# Patient Record
Sex: Male | Born: 1945 | Race: White | Hispanic: No | Marital: Married | Smoking: Current every day smoker
Health system: Southern US, Community
[De-identification: ages and names within clinical notes are randomized; demographics above are authoritative.]

## PROBLEM LIST (undated history)

## (undated) DIAGNOSIS — R0602 Shortness of breath: Secondary | ICD-10-CM

## (undated) DIAGNOSIS — K5730 Diverticulosis of large intestine without perforation or abscess without bleeding: Secondary | ICD-10-CM

## (undated) DIAGNOSIS — F17200 Nicotine dependence, unspecified, uncomplicated: Secondary | ICD-10-CM

## (undated) DIAGNOSIS — N200 Calculus of kidney: Secondary | ICD-10-CM

## (undated) DIAGNOSIS — F419 Anxiety disorder, unspecified: Secondary | ICD-10-CM

## (undated) DIAGNOSIS — R0789 Other chest pain: Secondary | ICD-10-CM

## (undated) DIAGNOSIS — I319 Disease of pericardium, unspecified: Secondary | ICD-10-CM

## (undated) DIAGNOSIS — M4306 Spondylolysis, lumbar region: Secondary | ICD-10-CM

## (undated) DIAGNOSIS — M1990 Unspecified osteoarthritis, unspecified site: Secondary | ICD-10-CM

## (undated) HISTORY — DX: Unspecified osteoarthritis, unspecified site: M19.90

## (undated) HISTORY — DX: Shortness of breath: R06.02

## (undated) HISTORY — PX: TONSILLECTOMY: SUR1361

## (undated) HISTORY — PX: COLONOSCOPY: SHX174

## (undated) HISTORY — DX: Nicotine dependence, unspecified, uncomplicated: F17.200

## (undated) HISTORY — DX: Anxiety disorder, unspecified: F41.9

## (undated) HISTORY — DX: Other chest pain: R07.89

---

## 1973-05-14 HISTORY — PX: BACK SURGERY: SHX140

## 1978-05-14 DIAGNOSIS — I319 Disease of pericardium, unspecified: Secondary | ICD-10-CM

## 1978-05-14 HISTORY — DX: Disease of pericardium, unspecified: I31.9

## 2001-11-05 ENCOUNTER — Emergency Department (HOSPITAL_COMMUNITY): Admission: EM | Admit: 2001-11-05 | Discharge: 2001-11-05 | Payer: Self-pay | Admitting: Emergency Medicine

## 2011-03-27 ENCOUNTER — Other Ambulatory Visit: Payer: Self-pay | Admitting: Family Medicine

## 2011-03-27 ENCOUNTER — Other Ambulatory Visit: Payer: Self-pay | Admitting: Anesthesiology

## 2011-03-27 DIAGNOSIS — M545 Low back pain: Secondary | ICD-10-CM

## 2011-03-30 ENCOUNTER — Ambulatory Visit
Admission: RE | Admit: 2011-03-30 | Discharge: 2011-03-30 | Disposition: A | Discharge status: Home / Self Care | Payer: BC Managed Care – PPO | Source: Ambulatory Visit | Attending: Family Medicine | Admitting: Family Medicine

## 2011-03-30 DIAGNOSIS — M545 Low back pain: Secondary | ICD-10-CM

## 2011-03-30 MED ORDER — GADOBENATE DIMEGLUMINE 529 MG/ML IV SOLN
16.0000 mL | Freq: Once | INTRAVENOUS | Status: AC | PRN
  Administered 2011-03-30: 16 mL via INTRAVENOUS

## 2011-10-07 ENCOUNTER — Encounter: Payer: Self-pay | Admitting: *Deleted

## 2012-01-28 ENCOUNTER — Encounter: Payer: Self-pay | Admitting: *Deleted

## 2013-08-31 ENCOUNTER — Other Ambulatory Visit (HOSPITAL_COMMUNITY): Payer: Self-pay | Admitting: Family Medicine

## 2013-08-31 ENCOUNTER — Other Ambulatory Visit: Payer: Self-pay

## 2013-08-31 ENCOUNTER — Inpatient Hospital Stay (HOSPITAL_COMMUNITY)
Admission: EM | Admit: 2013-08-31 | Discharge: 2013-09-05 | DRG: 419 | Disposition: A | Discharge status: Home / Self Care | Payer: Medicare HMO | Attending: General Surgery | Admitting: General Surgery

## 2013-08-31 ENCOUNTER — Ambulatory Visit (HOSPITAL_COMMUNITY)
Admission: RE | Admit: 2013-08-31 | Discharge: 2013-08-31 | Disposition: A | Discharge status: Home / Self Care | Payer: Medicare HMO | Source: Ambulatory Visit | Attending: Family Medicine | Admitting: Family Medicine

## 2013-08-31 ENCOUNTER — Encounter (HOSPITAL_COMMUNITY): Payer: Self-pay | Admitting: Surgery

## 2013-08-31 ENCOUNTER — Encounter (HOSPITAL_COMMUNITY): Payer: Self-pay

## 2013-08-31 DIAGNOSIS — M129 Arthropathy, unspecified: Secondary | ICD-10-CM | POA: Diagnosis present

## 2013-08-31 DIAGNOSIS — Z87442 Personal history of urinary calculi: Secondary | ICD-10-CM

## 2013-08-31 DIAGNOSIS — R1031 Right lower quadrant pain: Secondary | ICD-10-CM

## 2013-08-31 DIAGNOSIS — R197 Diarrhea, unspecified: Secondary | ICD-10-CM

## 2013-08-31 DIAGNOSIS — K449 Diaphragmatic hernia without obstruction or gangrene: Secondary | ICD-10-CM | POA: Insufficient documentation

## 2013-08-31 DIAGNOSIS — M47817 Spondylosis without myelopathy or radiculopathy, lumbosacral region: Secondary | ICD-10-CM | POA: Insufficient documentation

## 2013-08-31 DIAGNOSIS — K828 Other specified diseases of gallbladder: Secondary | ICD-10-CM | POA: Insufficient documentation

## 2013-08-31 DIAGNOSIS — N281 Cyst of kidney, acquired: Secondary | ICD-10-CM

## 2013-08-31 DIAGNOSIS — K8020 Calculus of gallbladder without cholecystitis without obstruction: Secondary | ICD-10-CM | POA: Insufficient documentation

## 2013-08-31 DIAGNOSIS — R0602 Shortness of breath: Secondary | ICD-10-CM | POA: Diagnosis present

## 2013-08-31 DIAGNOSIS — F419 Anxiety disorder, unspecified: Secondary | ICD-10-CM | POA: Diagnosis present

## 2013-08-31 DIAGNOSIS — K5730 Diverticulosis of large intestine without perforation or abscess without bleeding: Secondary | ICD-10-CM

## 2013-08-31 DIAGNOSIS — Z720 Tobacco use: Secondary | ICD-10-CM | POA: Diagnosis present

## 2013-08-31 DIAGNOSIS — Q438 Other specified congenital malformations of intestine: Secondary | ICD-10-CM | POA: Insufficient documentation

## 2013-08-31 DIAGNOSIS — N400 Enlarged prostate without lower urinary tract symptoms: Secondary | ICD-10-CM | POA: Insufficient documentation

## 2013-08-31 DIAGNOSIS — I700 Atherosclerosis of aorta: Secondary | ICD-10-CM

## 2013-08-31 DIAGNOSIS — R1012 Left upper quadrant pain: Secondary | ICD-10-CM | POA: Insufficient documentation

## 2013-08-31 DIAGNOSIS — F411 Generalized anxiety disorder: Secondary | ICD-10-CM | POA: Diagnosis present

## 2013-08-31 DIAGNOSIS — Z79899 Other long term (current) drug therapy: Secondary | ICD-10-CM

## 2013-08-31 DIAGNOSIS — H9190 Unspecified hearing loss, unspecified ear: Secondary | ICD-10-CM | POA: Diagnosis present

## 2013-08-31 DIAGNOSIS — K812 Acute cholecystitis with chronic cholecystitis: Secondary | ICD-10-CM | POA: Diagnosis present

## 2013-08-31 DIAGNOSIS — N200 Calculus of kidney: Secondary | ICD-10-CM

## 2013-08-31 DIAGNOSIS — R0789 Other chest pain: Secondary | ICD-10-CM | POA: Diagnosis present

## 2013-08-31 DIAGNOSIS — Z7982 Long term (current) use of aspirin: Secondary | ICD-10-CM

## 2013-08-31 DIAGNOSIS — I2510 Atherosclerotic heart disease of native coronary artery without angina pectoris: Secondary | ICD-10-CM | POA: Insufficient documentation

## 2013-08-31 DIAGNOSIS — R109 Unspecified abdominal pain: Secondary | ICD-10-CM

## 2013-08-31 DIAGNOSIS — R1011 Right upper quadrant pain: Secondary | ICD-10-CM

## 2013-08-31 DIAGNOSIS — R0902 Hypoxemia: Secondary | ICD-10-CM | POA: Diagnosis present

## 2013-08-31 DIAGNOSIS — K8066 Calculus of gallbladder and bile duct with acute and chronic cholecystitis without obstruction: Principal | ICD-10-CM | POA: Diagnosis present

## 2013-08-31 DIAGNOSIS — K819 Cholecystitis, unspecified: Secondary | ICD-10-CM

## 2013-08-31 DIAGNOSIS — K8050 Calculus of bile duct without cholangitis or cholecystitis without obstruction: Secondary | ICD-10-CM

## 2013-08-31 DIAGNOSIS — F17200 Nicotine dependence, unspecified, uncomplicated: Secondary | ICD-10-CM | POA: Diagnosis present

## 2013-08-31 HISTORY — DX: Diverticulosis of large intestine without perforation or abscess without bleeding: K57.30

## 2013-08-31 HISTORY — DX: Calculus of kidney: N20.0

## 2013-08-31 HISTORY — DX: Disease of pericardium, unspecified: I31.9

## 2013-08-31 HISTORY — DX: Spondylolysis, lumbar region: M43.06

## 2013-08-31 LAB — CBC WITH DIFFERENTIAL/PLATELET
Basophils Absolute: 0.1 10*3/uL (ref 0.0–0.1)
Basophils Relative: 0 % (ref 0–1)
Eosinophils Absolute: 0.6 10*3/uL (ref 0.0–0.7)
Eosinophils Relative: 4 % (ref 0–5)
HCT: 42.5 % (ref 39.0–52.0)
Hemoglobin: 14.8 g/dL (ref 13.0–17.0)
LYMPHS ABS: 1.6 10*3/uL (ref 0.7–4.0)
LYMPHS PCT: 10 % — AB (ref 12–46)
MCH: 31.4 pg (ref 26.0–34.0)
MCHC: 34.8 g/dL (ref 30.0–36.0)
MCV: 90.2 fL (ref 78.0–100.0)
Monocytes Absolute: 1.2 10*3/uL — ABNORMAL HIGH (ref 0.1–1.0)
Monocytes Relative: 7 % (ref 3–12)
NEUTROS ABS: 12.6 10*3/uL — AB (ref 1.7–7.7)
NEUTROS PCT: 79 % — AB (ref 43–77)
PLATELETS: 329 10*3/uL (ref 150–400)
RBC: 4.71 MIL/uL (ref 4.22–5.81)
RDW: 13.4 % (ref 11.5–15.5)
WBC: 16 10*3/uL — AB (ref 4.0–10.5)

## 2013-08-31 LAB — COMPREHENSIVE METABOLIC PANEL
ALT: 70 U/L — AB (ref 0–53)
AST: 29 U/L (ref 0–37)
Albumin: 3.2 g/dL — ABNORMAL LOW (ref 3.5–5.2)
Alkaline Phosphatase: 113 U/L (ref 39–117)
BUN: 12 mg/dL (ref 6–23)
CALCIUM: 9.4 mg/dL (ref 8.4–10.5)
CO2: 26 meq/L (ref 19–32)
Chloride: 97 mEq/L (ref 96–112)
Creatinine, Ser: 0.95 mg/dL (ref 0.50–1.35)
GFR calc Af Amer: >90 mL/min (ref >90)
GFR, EST NON AFRICAN AMERICAN: 85 mL/min — AB (ref >90)
Glucose, Bld: 89 mg/dL (ref 70–99)
POTASSIUM: 4.4 meq/L (ref 3.7–5.3)
SODIUM: 135 meq/L — AB (ref 137–147)
TOTAL PROTEIN: 7.4 g/dL (ref 6.0–8.3)
Total Bilirubin: 0.4 mg/dL (ref 0.3–1.2)

## 2013-08-31 LAB — PROTIME-INR
INR: 1.06 (ref 0.00–1.49)
Prothrombin Time: 13.6 seconds (ref 11.6–15.2)

## 2013-08-31 LAB — POCT I-STAT CREATININE: CREATININE: 1.1 mg/dL (ref 0.50–1.35)

## 2013-08-31 LAB — SURGICAL PCR SCREEN
MRSA, PCR: NEGATIVE
Staphylococcus aureus: NEGATIVE

## 2013-08-31 LAB — APTT: aPTT: 38 seconds — ABNORMAL HIGH (ref 24–37)

## 2013-08-31 MED ORDER — SODIUM CHLORIDE 0.9 % IV SOLN
3.0000 g | Freq: Four times a day (QID) | INTRAVENOUS | Status: DC
  Administered 2013-08-31 – 2013-09-04 (×15): 3 g via INTRAVENOUS
  Filled 2013-08-31 (×19): qty 3

## 2013-08-31 MED ORDER — ONDANSETRON HCL 4 MG/2ML IJ SOLN: 4.0000 mg | Freq: Four times a day (QID) | INTRAMUSCULAR | Status: DC | PRN

## 2013-08-31 MED ORDER — ONDANSETRON HCL 4 MG PO TABS: 4.0000 mg | ORAL_TABLET | Freq: Four times a day (QID) | ORAL | Status: DC | PRN

## 2013-08-31 MED ORDER — LACTATED RINGERS IV BOLUS (SEPSIS): 1000.0000 mL | Freq: Once | INTRAVENOUS | Status: DC

## 2013-08-31 MED ORDER — IOHEXOL 300 MG/ML  SOLN
50.0000 mL | Freq: Once | INTRAMUSCULAR | Status: AC | PRN
Start: 2013-08-31 — End: 2013-08-31
  Administered 2013-08-31: 50 mL via ORAL

## 2013-08-31 MED ORDER — PROMETHAZINE HCL 25 MG/ML IJ SOLN
6.2500 mg | Freq: Four times a day (QID) | INTRAMUSCULAR | Status: DC | PRN
  Filled 2013-08-31: qty 1

## 2013-08-31 MED ORDER — HYDROMORPHONE HCL PF 1 MG/ML IJ SOLN
0.5000 mg | INTRAMUSCULAR | Status: DC | PRN
  Administered 2013-09-02: 2 mg via INTRAVENOUS
  Administered 2013-09-02 (×2): 1 mg via INTRAVENOUS
  Administered 2013-09-03 (×6): 2 mg via INTRAVENOUS
  Filled 2013-08-31 (×3): qty 2
  Filled 2013-08-31: qty 1
  Filled 2013-08-31 (×2): qty 2
  Filled 2013-08-31: qty 1
  Filled 2013-08-31: qty 2
  Filled 2013-08-31: qty 1
  Filled 2013-08-31: qty 2

## 2013-08-31 MED ORDER — PHENOL 1.4 % MT LIQD
2.0000 | OROMUCOSAL | Status: DC | PRN
  Filled 2013-08-31: qty 177

## 2013-08-31 MED ORDER — DOCUSATE SODIUM 100 MG PO CAPS
100.0000 mg | ORAL_CAPSULE | Freq: Every day | ORAL | Status: DC
  Administered 2013-08-31 – 2013-09-04 (×5): 100 mg via ORAL
  Filled 2013-08-31 (×6): qty 1

## 2013-08-31 MED ORDER — LORATADINE 10 MG PO TABS
10.0000 mg | ORAL_TABLET | Freq: Every day | ORAL | Status: DC
  Administered 2013-08-31 – 2013-09-05 (×5): 10 mg via ORAL
  Filled 2013-08-31 (×6): qty 1

## 2013-08-31 MED ORDER — METOPROLOL TARTRATE 1 MG/ML IV SOLN
5.0000 mg | Freq: Four times a day (QID) | INTRAVENOUS | Status: DC | PRN
  Filled 2013-08-31: qty 5

## 2013-08-31 MED ORDER — TRAZODONE HCL 100 MG PO TABS
200.0000 mg | ORAL_TABLET | Freq: Every day | ORAL | Status: DC
  Administered 2013-08-31 – 2013-09-04 (×5): 200 mg via ORAL
  Filled 2013-08-31 (×6): qty 2

## 2013-08-31 MED ORDER — ACETAMINOPHEN 325 MG PO TABS
650.0000 mg | ORAL_TABLET | Freq: Four times a day (QID) | ORAL | Status: DC | PRN
  Administered 2013-08-31 – 2013-09-03 (×2): 650 mg via ORAL
  Filled 2013-08-31 (×2): qty 2

## 2013-08-31 MED ORDER — MAGNESIUM HYDROXIDE 400 MG/5ML PO SUSP: 30.0000 mL | Freq: Two times a day (BID) | ORAL | Status: DC | PRN

## 2013-08-31 MED ORDER — PSYLLIUM 95 % PO PACK
1.0000 | PACK | Freq: Two times a day (BID) | ORAL | Status: DC
  Administered 2013-08-31 – 2013-09-05 (×4): 1 via ORAL
  Filled 2013-08-31 (×11): qty 1

## 2013-08-31 MED ORDER — VENLAFAXINE HCL ER 75 MG PO CP24
225.0000 mg | ORAL_CAPSULE | Freq: Every day | ORAL | Status: DC
  Administered 2013-09-01 – 2013-09-05 (×4): 225 mg via ORAL
  Filled 2013-08-31 (×6): qty 1

## 2013-08-31 MED ORDER — HEPARIN SODIUM (PORCINE) 5000 UNIT/ML IJ SOLN
5000.0000 [IU] | Freq: Three times a day (TID) | INTRAMUSCULAR | Status: DC
  Administered 2013-08-31 – 2013-09-05 (×11): 5000 [IU] via SUBCUTANEOUS
  Filled 2013-08-31 (×17): qty 1

## 2013-08-31 MED ORDER — LORAZEPAM 2 MG/ML IJ SOLN: 0.5000 mg | Freq: Three times a day (TID) | INTRAMUSCULAR | Status: DC | PRN

## 2013-08-31 MED ORDER — DIPHENHYDRAMINE HCL 50 MG/ML IJ SOLN
12.5000 mg | Freq: Four times a day (QID) | INTRAMUSCULAR | Status: DC | PRN
  Administered 2013-09-02 – 2013-09-03 (×2): 25 mg via INTRAVENOUS
  Filled 2013-08-31 (×2): qty 1

## 2013-08-31 MED ORDER — CITALOPRAM HYDROBROMIDE 20 MG PO TABS
20.0000 mg | ORAL_TABLET | Freq: Every day | ORAL | Status: DC
  Filled 2013-08-31: qty 1

## 2013-08-31 MED ORDER — LIP MEDEX EX OINT
1.0000 "application " | TOPICAL_OINTMENT | Freq: Two times a day (BID) | CUTANEOUS | Status: DC
  Administered 2013-08-31 – 2013-09-03 (×5): 1 via TOPICAL
  Filled 2013-08-31 (×2): qty 7

## 2013-08-31 MED ORDER — BISACODYL 10 MG RE SUPP: 10.0000 mg | Freq: Two times a day (BID) | RECTAL | Status: DC | PRN

## 2013-08-31 MED ORDER — MENTHOL 3 MG MT LOZG
1.0000 | LOZENGE | OROMUCOSAL | Status: DC | PRN
  Filled 2013-08-31: qty 9

## 2013-08-31 MED ORDER — IOHEXOL 300 MG/ML  SOLN
80.0000 mL | Freq: Once | INTRAMUSCULAR | Status: AC | PRN
  Administered 2013-08-31: 80 mL via INTRAVENOUS

## 2013-08-31 MED ORDER — METOPROLOL TARTRATE 12.5 MG HALF TABLET
12.5000 mg | ORAL_TABLET | Freq: Two times a day (BID) | ORAL | Status: DC | PRN
  Filled 2013-08-31: qty 1

## 2013-08-31 MED ORDER — CITALOPRAM HYDROBROMIDE 20 MG PO TABS
20.0000 mg | ORAL_TABLET | Freq: Every day | ORAL | Status: DC
  Administered 2013-09-01 – 2013-09-05 (×4): 20 mg via ORAL
  Filled 2013-08-31 (×5): qty 1

## 2013-08-31 MED ORDER — LACTATED RINGERS IV BOLUS (SEPSIS)
1000.0000 mL | Freq: Three times a day (TID) | INTRAVENOUS | Status: AC | PRN
Start: 2013-08-31 — End: 2013-09-02

## 2013-08-31 MED ORDER — KCL IN DEXTROSE-NACL 40-5-0.45 MEQ/L-%-% IV SOLN
INTRAVENOUS | Status: DC
  Administered 2013-08-31 – 2013-09-04 (×5): via INTRAVENOUS
  Filled 2013-08-31 (×10): qty 1000

## 2013-08-31 MED ORDER — VITAMIN E 180 MG (400 UNIT) PO CAPS
400.0000 [IU] | ORAL_CAPSULE | Freq: Every day | ORAL | Status: DC
  Administered 2013-09-01 – 2013-09-05 (×3): 400 [IU] via ORAL
  Filled 2013-08-31 (×5): qty 1

## 2013-08-31 MED ORDER — SACCHAROMYCES BOULARDII 250 MG PO CAPS
250.0000 mg | ORAL_CAPSULE | Freq: Two times a day (BID) | ORAL | Status: DC
  Administered 2013-08-31 – 2013-09-05 (×7): 250 mg via ORAL
  Filled 2013-08-31 (×12): qty 1

## 2013-08-31 MED ORDER — ALUM & MAG HYDROXIDE-SIMETH 200-200-20 MG/5ML PO SUSP
30.0000 mL | Freq: Four times a day (QID) | ORAL | Status: DC | PRN
  Filled 2013-08-31: qty 30

## 2013-08-31 MED ORDER — MAGIC MOUTHWASH
15.0000 mL | Freq: Four times a day (QID) | ORAL | Status: DC | PRN
  Filled 2013-08-31: qty 15

## 2013-08-31 MED ORDER — ACETAMINOPHEN 650 MG RE SUPP: 650.0000 mg | Freq: Four times a day (QID) | RECTAL | Status: DC | PRN

## 2013-08-31 MED ORDER — HYDROCOD POLST-CHLORPHEN POLST 10-8 MG/5ML PO LQCR: 5.0000 mL | Freq: Two times a day (BID) | ORAL | Status: DC | PRN

## 2013-08-31 MED ORDER — DIPHENHYDRAMINE HCL 12.5 MG/5ML PO ELIX
12.5000 mg | ORAL_SOLUTION | Freq: Four times a day (QID) | ORAL | Status: DC | PRN
  Administered 2013-09-03: 12.5 mg via ORAL
  Administered 2013-09-03: 25 mg via ORAL
  Filled 2013-08-31 (×2): qty 10

## 2013-08-31 NOTE — ED Provider Notes (Signed)
CSN: [id number]     Arrival date & time 08/31/13  1635 History   First MD Initiated Contact with Patient 08/31/13 1711     Chief Complaint  Patient presents with  . Abdominal Pain   HPI  Ernest Horton is a 68 y.o. male with a PMH of pericarditis, arthritis, anxiety, tobacco abuse, SOB and chest pressure who presents to the ED for evaluation of abdominal pain. History was provided by the patient. Patient has had intermittent RUQ abdominal pain for the past week. Pain radiates to the umbilical region. Pain comes and goes and is described as a sharp pain. Pain worse with eating and movement. No similar abdominal pain in the past. Associated symptoms include nausea with no emesis and decreased appetite. No diarrhea, constipation, dysuria. Has had a subjective fever with no documented fever. Nothing provided for pain PTA. Went to PCP and had CT scan which showed acute cholecystitis. Patient sent to ED for cholecystectomy. Last PO intake 6:30 am this morning.    Past Medical History  Diagnosis Date  . Chest pressure     EXERTIONAL  . SOB (shortness of breath)   . Tobacco dependence   . History of pericarditis 1980  . Arthritis   . Anxiety    No past surgical history on file. Family History  Problem Relation Age of Onset  . GI problems Mother     ESOPHAGEAL PROBLEMS  . Cancer Father     PROSTATE   History  Substance Use Topics  . Smoking status: Current Every Day Smoker -- 60 years    Types: Cigarettes  . Smokeless tobacco: Not on file  . Alcohol Use: Yes     Comment: RARELY...THOUGH HE DOES DRINK 2-3 CUPS OF COFFEE DAILY    Review of Systems  Constitutional: Positive for fever (subjective) and appetite change. Negative for chills, diaphoresis, activity change and fatigue.  HENT: Negative for sore throat.   Respiratory: Negative for cough and shortness of breath.   Cardiovascular: Negative for chest pain and leg swelling.  Gastrointestinal: Positive for nausea and abdominal pain.  Negative for vomiting, diarrhea, constipation and rectal pain.  Genitourinary: Negative for dysuria and difficulty urinating.  Musculoskeletal: Negative for back pain and myalgias.  Neurological: Negative for dizziness, weakness, light-headedness and headaches.    Allergies  Review of patient's allergies indicates no known allergies.  Home Medications   Prior to Admission medications   Medication Sig Start Date End Date Taking? Authorizing Provider  aspirin EC 81 MG tablet Take 81 mg by mouth at bedtime.   Yes Historical Provider, MD  citalopram (CELEXA) 20 MG tablet Take 20 mg by mouth daily.   Yes Historical Provider, MD  docusate sodium (COLACE) 100 MG capsule Take 100 mg by mouth at bedtime.   Yes Historical Provider, MD  fexofenadine (ALLEGRA) 180 MG tablet Take 180 mg by mouth at bedtime.   Yes Historical Provider, MD  MULTIPLE VITAMIN PO Take 1 tablet by mouth daily.   Yes Historical Provider, MD  traZODone (DESYREL) 100 MG tablet Take 200 mg by mouth at bedtime.   Yes Historical Provider, MD  venlafaxine XR (EFFEXOR-XR) 75 MG 24 hr capsule Take 225 mg by mouth daily with breakfast.   Yes Historical Provider, MD  vitamin E 400 UNIT capsule Take 400 Units by mouth daily.   Yes Historical Provider, MD   BP 145/90  Pulse 94  Temp(Src) 98.8 F (37.1 C) (Oral)  Resp 18  Ht 5\' 10"  (1.778 m)  Wt 188 lb (85.276 kg)  BMI 26.98 kg/m2  SpO2 96%  Filed Vitals:   08/31/13 1642 08/31/13 1923 08/31/13 2230 09/01/13 0239  BP: 145/90 133/86 147/82 131/80  Pulse: 94 91 87 77  Temp: 98.8 F (37.1 C) 98.7 F (37.1 C) 98.8 F (37.1 C) 97.4 F (36.3 C)  TempSrc: Oral Oral Oral Oral  Resp: 18 18 18 18   Height: 5\' 10"  (1.778 m)     Weight: 188 lb (85.276 kg)     SpO2: 96% 93% 94% 92%    Physical Exam  Nursing note and vitals reviewed. Constitutional: He appears well-developed and well-nourished. No distress.  HENT:  Head: Normocephalic and atraumatic.  Right Ear: External ear  normal.  Left Ear: External ear normal.  Nose: Nose normal.  Mouth/Throat: Oropharynx is clear and moist. No oropharyngeal exudate.  Eyes: Conjunctivae are normal. Right eye exhibits no discharge. Left eye exhibits no discharge.  Neck: Normal range of motion. Neck supple.  Cardiovascular: Normal rate, regular rhythm and normal heart sounds.  Exam reveals no gallop and no friction rub.   No murmur heard. Pulmonary/Chest: Effort normal and breath sounds normal. No respiratory distress. He has no wheezes. He has no rales. He exhibits no tenderness.  Abdominal: Soft. Bowel sounds are normal. He exhibits no distension and no mass. There is tenderness. There is no rebound and no guarding.  Tenderness to palpation to the RUQ. Positive Murphy's sign.   Musculoskeletal: Normal range of motion. He exhibits no edema and no tenderness.  No flank, lumbar, or CVA tenderness bilaterally  Neurological: He is alert.  Skin: Skin is warm and dry. He is not diaphoretic.    ED Course  Procedures (including critical care time) Labs Review Labs Reviewed - No data to display  Imaging Review Ct Abdomen Pelvis W Contrast  08/31/2013   CLINICAL DATA:  Right lower quadrant pain and some left lower quadrant pain. History of diarrhea last week. No previous relevant surgery.  EXAM: CT ABDOMEN AND PELVIS WITH CONTRAST  TECHNIQUE: Multidetector CT imaging of the abdomen and pelvis was performed using the standard protocol following bolus administration of intravenous contrast.  CONTRAST:  80mL OMNIPAQUE IOHEXOL 300 MG/ML  SOLN  COMPARISON:  MR L SPINE WO/W CM dated 03/30/2011  FINDINGS: The lung bases are essentially clear. There is no pleural or pericardial effusion. Coronary artery calcifications and a small hiatal hernia are noted.  The gallbladder demonstrates irregular wall thickening with surrounding inflammatory change suspicious for cholecystitis. There are multiple calcified and noncalcified gallstones, including 1  measuring 13 mm in the gallbladder neck. There is no intra or extrahepatic biliary dilatation. No calcified common duct stones are seen.  The liver, spleen, pancreas and adrenal glands appear normal. There are possible tiny nonobstructing left renal calculi. The right kidney appears normal. There is no hydronephrosis.  The stomach and small bowel appear normal. There is a retrocecal appendix which is normal in caliber without surrounding inflammation. There are sigmoid colon diverticular changes without surrounding inflammation.  There is mild aortoiliac atherosclerosis and moderate enlargement of the prostate gland. The seminal vesicles and urinary bladder appear normal.  There is lower lumbar spondylosis with advanced disc space loss at L4-5 and L5-S1. No worrisome osseous findings are demonstrated.  IMPRESSION: 1. Findings are consistent with acute cholecystitis. There is no evidence of biliary obstruction. 2. No evidence of appendicitis. 3. Sigmoid diverticulosis without evidence of acute inflammation. 4. Possible nonobstructing left renal calculi. 5. These results were called by  telephone at the time of interpretation on 08/31/2013 at 4:28 PM to Dr. Marjory Lies , who verbally acknowledged these results.   Electronically Signed   By: Roxy Horseman M.D.   On: 08/31/2013 16:28     EKG Interpretation None      Results for orders placed during the hospital encounter of 08/31/13  SURGICAL PCR SCREEN      Result Value Ref Range   MRSA, PCR NEGATIVE  NEGATIVE   Staphylococcus aureus NEGATIVE  NEGATIVE  CBC WITH DIFFERENTIAL      Result Value Ref Range   WBC 16.0 (*) 4.0 - 10.5 K/uL   RBC 4.71  4.22 - 5.81 MIL/uL   Hemoglobin 14.8  13.0 - 17.0 g/dL   HCT 54.0  98.1 - 19.1 %   MCV 90.2  78.0 - 100.0 fL   MCH 31.4  26.0 - 34.0 pg   MCHC 34.8  30.0 - 36.0 g/dL   RDW 47.8  29.5 - 62.1 %   Platelets 329  150 - 400 K/uL   Neutrophils Relative % 79 (*) 43 - 77 %   Neutro Abs 12.6 (*) 1.7 - 7.7 K/uL    Lymphocytes Relative 10 (*) 12 - 46 %   Lymphs Abs 1.6  0.7 - 4.0 K/uL   Monocytes Relative 7  3 - 12 %   Monocytes Absolute 1.2 (*) 0.1 - 1.0 K/uL   Eosinophils Relative 4  0 - 5 %   Eosinophils Absolute 0.6  0.0 - 0.7 K/uL   Basophils Relative 0  0 - 1 %   Basophils Absolute 0.1  0.0 - 0.1 K/uL  COMPREHENSIVE METABOLIC PANEL      Result Value Ref Range   Sodium 135 (*) 137 - 147 mEq/L   Potassium 4.4  3.7 - 5.3 mEq/L   Chloride 97  96 - 112 mEq/L   CO2 26  19 - 32 mEq/L   Glucose, Bld 89  70 - 99 mg/dL   BUN 12  6 - 23 mg/dL   Creatinine, Ser 3.08  0.50 - 1.35 mg/dL   Calcium 9.4  8.4 - 65.7 mg/dL   Total Protein 7.4  6.0 - 8.3 g/dL   Albumin 3.2 (*) 3.5 - 5.2 g/dL   AST 29  0 - 37 U/L   ALT 70 (*) 0 - 53 U/L   Alkaline Phosphatase 113  39 - 117 U/L   Total Bilirubin 0.4  0.3 - 1.2 mg/dL   GFR calc non Af Amer 85 (*) >90 mL/min   GFR calc Af Amer >90  >90 mL/min  PROTIME-INR      Result Value Ref Range   Prothrombin Time 13.6  11.6 - 15.2 seconds   INR 1.06  0.00 - 1.49  APTT      Result Value Ref Range   aPTT 38 (*) 24 - 37 seconds     MDM   Ernest Horton is a 68 y.o. male with a PMH of pericarditis, arthritis, anxiety, tobacco abuse, SOB and chest pressure who presents to the ED for evaluation of abdominal pain.  Rechecks  7:15 PM = Pain controlled. Patient aware of plan and is in agreement.    Consults  7:30 PM = Spoke with Dr. Michaell Cowing who will come to evaluate the patient. No further orders.      Patient admitted for further evaluation and management of his cholecystitis. Surgery consulted and will evaluate patient. Patient has leukocytosis of 16. ALT also elevated  at 70. Labs otherwise unremarkable. Vital signs stable. Pain controlled. Patient in agreement with admission and plan.    Final impressions: 1. Cholecystitis       Luiz IronJessica Katlin Kedrick Mcnamee PA-C   This patient was discussed with Dr. Jyl HeinzGhim          Liahna Brickner K Khaleah Duer, PA-C 09/01/13 [phone]

## 2013-08-31 NOTE — ED Notes (Signed)
Pt states that he has been having R sided abdominal pain x 1 week w/ 1 bout of nausea. Denies diarrhea. Was sent in by Dr. Doristine CounterBurnett to r/o appendicitis and was told that he has cholecystitis.

## 2013-08-31 NOTE — Progress Notes (Signed)
  CARE MANAGEMENT ED NOTE 08/31/2013  Patient:  Myer HaffROBERTSON,Zackarie   Account Number:  [id number]  Date Initiated:  08/31/2013  Documentation initiated by:  Radford PaxFERRERO,Paquita Printy  Subjective/Objective Assessment:   Patient presents to Ed with right sided abdominal pain fro one week     Subjective/Objective Assessment Detail:     Action/Plan:   Action/Plan Detail:   Anticipated DC Date:       Status Recommendation to Physician:   Result of Recommendation:    Other ED Services  Consult Working Plan    DC Planning Services  Other  PCP issues    Choice offered to / List presented to:            Status of service:  Completed, signed off  ED Comments:   ED Comments Detail:  EDCM spoke to patient at bedside.  Patient confirms his pcp is Dr[facility] Fuller [facility]Brent [facility] in [place]. System updated.

## 2013-08-31 NOTE — H&P (Addendum)
[place], MD, [address] [place], [place] [address] Phone: [phone] FAX: [phone]     Gracen Southwell  09-30-1945 [id number]  CARE TEAM:  PCP: Stephens Shire, MD  Outpatient Care Team: Patient Care Team: Stephens Shire, MD as PCP - General (Family Medicine)  Inpatient Treatment Team: Treatment Team: Attending Provider: Saddie Benders. Dorna Mai, MD; Registered Nurse: Haydee Salter, RN; Physician Assistant: Lucila Maine, PA-C; Consulting Physician: Nolon Nations, MD  This patient is a 68 y.o.male who presents today for surgical evaluation at the request of Vernie Murders, PA-C.   Reason for evaluation: Abdominal pain.  Presumed cholecystitis.  Smoking no history of exertional chest pain and pressure and pericarditis in the past.  He has had intermittent episodes of right upper quadrant abdominal pain.  He was concerned.  What his primary care physician.  CAT scan ordered very concerning for cholecystitis.  Patient was told to go to the emergency room.  He notes the pain started in the right upper abdomen.  Sometimes he can feel it radiating to his bellybutton.  Some nausea.  No emesis.  Some fevers but no chills or sweats.  He felt as well he was driving up to [place] to pick up something for his Model T. car.  Recalls the worst episode after having a heavy meal.  History of mild heartburn controlled relates.  This does not feel like that.  They rarely drinks any alcohol.  No history of hepatitis or pancreatitis.  History of colon polyps treated by Dr. Juanita Craver.  Not due for another colonoscopy in at least 5 years.  No personal nor family history of GI/colon cancer, inflammatory bowel disease, irritable bowel syndrome, allergy such as Celiac Sprue, dietary/dairy problems, colitis, ulcers nor gastritis.  No recent sick contacts/gastroenteritis.  No travel outside the country.  No changes in diet.  No  dysphagia to solids or liquids.  No significant heartburn or reflux.  No hematochezia, hematemesis, coffee ground emesis.  No evidence of prior gastric/peptic ulceration.  Was going to ride bicycles down towards [place] smokes.  Has tried to quit without much success in his life.  Past Medical History  Diagnosis Date  . Chest pressure     EXERTIONAL  . SOB (shortness of breath)   . Tobacco dependence   . History of pericarditis 1980  . Arthritis   . Anxiety   . Pericarditis   . Diverticulosis of sigmoid colon 08/31/2013  . Renal calculus, left 08/31/2013    CT scan 2015     No past surgical history on file.  History   Social History  . Marital Status: Married    Spouse Name: N/A    Number of Children: N/A  . Years of Education: N/A   Occupational History  . RETIRED     [employer]   Social History Main Topics  . Smoking status: Current Every Day Smoker -- 60 years    Types: Cigarettes  . Smokeless tobacco: Not on file  . Alcohol Use: Yes     Comment: RARELY...THOUGH HE DOES DRINK 2-3 CUPS OF COFFEE DAILY  . Drug Use: No  . Sexual Activity: Not on file   Other Topics Concern  . Not on file   Social History Narrative  . No narrative on file    Family History  Problem Relation Age of Onset  . GI problems  Mother     ESOPHAGEAL PROBLEMS  . Cancer Father     PROSTATE    No current facility-administered medications for this encounter.   Current Outpatient Prescriptions  Medication Sig Dispense Refill  . aspirin EC 81 MG tablet Take 81 mg by mouth at bedtime.      . citalopram (CELEXA) 20 MG tablet Take 20 mg by mouth daily.      Marland Kitchen docusate sodium (COLACE) 100 MG capsule Take 100 mg by mouth at bedtime.      . fexofenadine (ALLEGRA) 180 MG tablet Take 180 mg by mouth at bedtime.      . MULTIPLE VITAMIN PO Take 1 tablet by mouth daily.      . traZODone (DESYREL) 100 MG tablet Take 200 mg by mouth at bedtime.      Marland Kitchen venlafaxine XR (EFFEXOR-XR)  75 MG 24 hr capsule Take 225 mg by mouth daily with breakfast.      . vitamin E 400 UNIT capsule Take 400 Units by mouth daily.         No Known Allergies  ROS: Constitutional:  +fevers, No chills, sweats.  Weight stable.  Decreasd appetite Eyes:  No vision changes, No discharge HENT:  No sore throats, nasal drainage.  Some nasal congestion Lymph: No neck swelling, No bruising easily Pulmonary:  No cough, productive sputum CV: No orthopnea, PND.  No exertional chest/neck/shoulder/arm pain. GI: No personal nor family history of GI/colon cancer, inflammatory bowel disease, irritable bowel syndrome, allergy such as Celiac Sprue, dietary/dairy problems, colitis, ulcers nor gastritis.  No recent sick contacts/gastroenteritis.  No travel outside the country.  No changes in diet. Renal: No UTIs, No hematuria Genital:  No drainage, bleeding, masses Musculoskeletal: No severe joint pain.  Good ROM major joints Skin:  No sores or lesions.  No rashes Heme/Lymph:  No easy bleeding.  No swollen lymph nodes Neuro: No focal weakness/numbness.  No seizures Psych: No suicidal ideation.  No hallucinations  BP 133/86  Pulse 91  Temp(Src) 98.7 F (37.1 C) (Oral)  Resp 18  Ht _0  (1.778 m)  Wt 188 lb (85.276 kg)  BMI 26.98 kg/m2  SpO2 93%  Physical Exam: General: Pt awake/alert/oriented x4 in no major acute distress Eyes: PERRL, normal EOM. Sclera nonicteric Neuro: CN II-XII intact w/o focal sensory/motor deficits. Lymph: No head/neck/groin lymphadenopathy Psych:  No delerium/psychosis/paranoia.  Mildly anxious but consolable. HENT: Normocephalic, Mucus membranes moist.  No thrush.  Significant scarring on nose with mild rosacea.  No acne.  Moderately hard of hearing. Neck: Supple, No tracheal deviation Chest: No pain.  Good respiratory excursion.  Lungs clear to auscultation bilaterally CV:  Pulses intact.  Regular rhythm Abdomen: Soft, Nondistended.  Rather tender in the right upper  quadrant with positive Murphy sign.  Rest of the abdomen nontender.  No incarcerated hernias. GU: normal external genitalia.  No inguinal hernias. Ext:  SCDs BLE.  No significant edema.  No cyanosis Skin: No petechiae / purpurea.  No major sores Musculoskeletal: No severe joint pain.  Good ROM major joints   Results:   Labs: Results for orders placed during the hospital encounter of 08/31/13 (from the past 48 hour(s))  CBC WITH DIFFERENTIAL     Status: Abnormal   Collection Time    08/31/13  6:30 PM      Result Value Ref Range   WBC 16.0 (*) 4.0 - 10.5 K/uL   RBC 4.71  4.22 - 5.81 MIL/uL   Hemoglobin 14.8  13.0 -  17.0 g/dL   HCT 42.5  39.0 - 52.0 %   MCV 90.2  78.0 - 100.0 fL   MCH 31.4  26.0 - 34.0 pg   MCHC 34.8  30.0 - 36.0 g/dL   RDW 13.4  11.5 - 15.5 %   Platelets 329  150 - 400 K/uL   Neutrophils Relative % 79 (*) 43 - 77 %   Neutro Abs 12.6 (*) 1.7 - 7.7 K/uL   Lymphocytes Relative 10 (*) 12 - 46 %   Lymphs Abs 1.6  0.7 - 4.0 K/uL   Monocytes Relative 7  3 - 12 %   Monocytes Absolute 1.2 (*) 0.1 - 1.0 K/uL   Eosinophils Relative 4  0 - 5 %   Eosinophils Absolute 0.6  0.0 - 0.7 K/uL   Basophils Relative 0  0 - 1 %   Basophils Absolute 0.1  0.0 - 0.1 K/uL  COMPREHENSIVE METABOLIC PANEL     Status: Abnormal   Collection Time    08/31/13  6:30 PM      Result Value Ref Range   Sodium 135 (*) 137 - 147 mEq/L   Potassium 4.4  3.7 - 5.3 mEq/L   Chloride 97  96 - 112 mEq/L   CO2 26  19 - 32 mEq/L   Glucose, Bld 89  70 - 99 mg/dL   BUN 12  6 - 23 mg/dL   Creatinine, Ser 0.95  0.50 - 1.35 mg/dL   Calcium 9.4  8.4 - 10.5 mg/dL   Total Protein 7.4  6.0 - 8.3 g/dL   Albumin 3.2 (*) 3.5 - 5.2 g/dL   AST 29  0 - 37 U/L   ALT 70 (*) 0 - 53 U/L   Alkaline Phosphatase 113  39 - 117 U/L   Total Bilirubin 0.4  0.3 - 1.2 mg/dL   GFR calc non Af Amer 85 (*) >90 mL/min   GFR calc Af Amer >90  >90 mL/min   Comment: (NOTE)     The eGFR has been calculated using the CKD EPI  equation.     This calculation has not been validated in all clinical situations.     eGFR's persistently <90 mL/min signify possible Chronic Kidney     Disease.    Imaging / Studies: Ct Abdomen Pelvis W Contrast  08/31/2013   CLINICAL DATA:  Right lower quadrant pain and some left lower quadrant pain. History of diarrhea last week. No previous relevant surgery.  EXAM: CT ABDOMEN AND PELVIS WITH CONTRAST  TECHNIQUE: Multidetector CT imaging of the abdomen and pelvis was performed using the standard protocol following bolus administration of intravenous contrast.  CONTRAST:  105m OMNIPAQUE IOHEXOL 300 MG/ML  SOLN  COMPARISON:  MR L SPINE WO/W CM dated 03/30/2011  FINDINGS: The lung bases are essentially clear. There is no pleural or pericardial effusion. Coronary artery calcifications and a small hiatal hernia are noted.  The gallbladder demonstrates irregular wall thickening with surrounding inflammatory change suspicious for cholecystitis. There are multiple calcified and noncalcified gallstones, including 1 measuring 13 mm in the gallbladder neck. There is no intra or extrahepatic biliary dilatation. No calcified common duct stones are seen.  The liver, spleen, pancreas and adrenal glands appear normal. There are possible tiny nonobstructing left renal calculi. The right kidney appears normal. There is no hydronephrosis.  The stomach and small bowel appear normal. There is a retrocecal appendix which is normal in caliber without surrounding inflammation. There are sigmoid colon diverticular changes  without surrounding inflammation.  There is mild aortoiliac atherosclerosis and moderate enlargement of the prostate gland. The seminal vesicles and urinary bladder appear normal.  There is lower lumbar spondylosis with advanced disc space loss at L4-5 and L5-S1. No worrisome osseous findings are demonstrated.  IMPRESSION: 1. Findings are consistent with acute cholecystitis. There is no evidence of biliary  obstruction. 2. No evidence of appendicitis. 3. Sigmoid diverticulosis without evidence of acute inflammation. 4. Possible nonobstructing left renal calculi. 5. These results were called by telephone at the time of interpretation on 08/31/2013 at 4:28 PM to Dr. Juanita Craver , who verbally acknowledged these results.   Electronically Signed   By: Camie Patience M.D.   On: 08/31/2013 16:28    Medications / Allergies: per chart  Antibiotics: Anti-infectives   None      Assessment  Ernest Horton  68 y.o. male       Problem List:  Principal Problem:   Acute cholecystitis with chronic cholecystitis Active Problems:   Tobacco abuse   Anxiety   SOB (shortness of breath)   HOH (hard of hearing)   Acute cholecystitis  Plan:  Admit  IV antibiotics.  Unasyn.  IV fluid resuscitation.  Progressive nausea and pain control.  Laparoscopic cholecystectomy this admission.  Given the evening and backup operating room, plan for tomorrow.  I discussed with the patient and his wife at length.  They agreed to proceed:  The anatomy & physiology of hepatobiliary & pancreatic function was discussed.  The pathophysiology of gallbladder dysfunction was discussed.  Natural history risks without surgery was discussed.   I feel the risks of no intervention will lead to serious problems that outweigh the operative risks; therefore, I recommended cholecystectomy to remove the pathology.  I explained laparoscopic techniques with possible need for an open approach.  Probable cholangiogram to evaluate the bilary tract was explained as well.    Risks such as bleeding, infection, abscess, leak, injury to other organs, need for further treatment, heart attack, death, and other risks were discussed.  I noted a good likelihood this will help address the problem.  Possibility that this will not correct all abdominal symptoms was explained.  Goals of post-operative recovery were discussed as well.  We will work to  minimize complications.  An educational handout further explaining the pathology and treatment options was given as well.  Questions were answered.  The patient expresses understanding & wishes to proceed with surgery.  Anxiolysis.  Quit smoking.  STOP SMOKING! We talked to the patient about the dangers of smoking.  We stressed that tobacco use dramatically increases the risk of peri-operative complications such as infection, tissue necrosis leaving to problems with incision/wound and organ healing, hernia, chronic pain, heart attack, stroke, DVT, pulmonary embolism, and death.  We noted there are programs in our community to help stop smoking.  Information was available.  -VTE prophylaxis- SCDs, etc  -mobilize as tolerated to help recovery    Adin Hector, M.D., F.A.C.S. Gastrointestinal and Minimally Invasive [facility] [address] [place], [place] [address] [phone] Main / Paging   08/31/2013  Note: This dictation was prepared with Dragon/digital dictation along with [facility] technology. Any transcriptional errors that result from this process are unintentional.

## 2013-09-01 NOTE — Care Management Note (Signed)
    Page 1 of 1   09/01/2013     11:32:15 AM CARE MANAGEMENT NOTE 09/01/2013  Patient:  Ernest Horton,Ernest Horton   Account Number:  [id number]  Date Initiated:  09/01/2013  Documentation initiated by:  Lorenda IshiharaPEELE,Langley Ingalls  Subjective/Objective Assessment:   68 yo male admitted with RUQ pain, cholecystitis. PTA lived at home with spouse.     Action/Plan:   Home when stable   Anticipated DC Date:  09/03/2013   Anticipated DC Plan:  HOME/SELF CARE      DC Planning Services  CM consult      Choice offered to / List presented to:             Status of service:  Completed, signed off Medicare Important Message given?  NA - LOS <3 / Initial given by admissions (If response is "NO", the following Medicare IM given date fields will be blank) Date Medicare IM given:   Date Additional Medicare IM given:    Discharge Disposition:  HOME/SELF CARE  Per UR Regulation:  Reviewed for med. necessity/level of care/duration of stay  If discussed at Long Length of Stay Meetings, dates discussed:    Comments:

## 2013-09-01 NOTE — Progress Notes (Signed)
Subjective: Doing ok.  Feeling a little better.    Objective: Vital signs in last 24 hours: Temp:  [97.4 F (36.3 C)-98.8 F (37.1 C)] 97.9 F (36.6 C) (04/21 0610) Pulse Rate:  [77-94] 81 (04/21 0610) Resp:  [18] 18 (04/21 0610) BP: (129-147)/(78-90) 129/78 mmHg (04/21 0610) SpO2:  [92 %-96 %] 95 % (04/21 0610) Weight:  [188 lb (85.276 kg)] 188 lb (85.276 kg) (04/20 1642)    Intake/Output from previous day: 04/20 0701 - 04/21 0700 In: 1036.7 [I.V.:836.7; IV Piggyback:200] Out: 1925 [Urine:1925] Intake/Output this shift:    General appearance: alert, cooperative and no distress Resp: breathing comfortably GI: Mild RUQ tenderness  Lab Results:   Recent Labs  08/31/13 1830  WBC 16.0*  HGB 14.8  HCT 42.5  PLT 329   BMET  Recent Labs  08/31/13 1538 08/31/13 1830  NA  --  135*  K  --  4.4  CL  --  97  CO2  --  26  GLUCOSE  --  89  BUN  --  12  CREATININE 1.10 0.95  CALCIUM  --  9.4   PT/INR  Recent Labs  08/31/13 1925  LABPROT 13.6  INR 1.06   ABG No results found for this basename: PHART, PCO2, PO2, HCO3,  in the last 72 hours  Studies/Results: Ct Abdomen Pelvis W Contrast  08/31/2013   CLINICAL DATA:  Right lower quadrant pain and some left lower quadrant pain. History of diarrhea last week. No previous relevant surgery.  EXAM: CT ABDOMEN AND PELVIS WITH CONTRAST  TECHNIQUE: Multidetector CT imaging of the abdomen and pelvis was performed using the standard protocol following bolus administration of intravenous contrast.  CONTRAST:  80mL OMNIPAQUE IOHEXOL 300 MG/ML  SOLN  COMPARISON:  MR L SPINE WO/W CM dated 03/30/2011  FINDINGS: The lung bases are essentially clear. There is no pleural or pericardial effusion. Coronary artery calcifications and a small hiatal hernia are noted.  The gallbladder demonstrates irregular wall thickening with surrounding inflammatory change suspicious for cholecystitis. There are multiple calcified and noncalcified  gallstones, including 1 measuring 13 mm in the gallbladder neck. There is no intra or extrahepatic biliary dilatation. No calcified common duct stones are seen.  The liver, spleen, pancreas and adrenal glands appear normal. There are possible tiny nonobstructing left renal calculi. The right kidney appears normal. There is no hydronephrosis.  The stomach and small bowel appear normal. There is a retrocecal appendix which is normal in caliber without surrounding inflammation. There are sigmoid colon diverticular changes without surrounding inflammation.  There is mild aortoiliac atherosclerosis and moderate enlargement of the prostate gland. The seminal vesicles and urinary bladder appear normal.  There is lower lumbar spondylosis with advanced disc space loss at L4-5 and L5-S1. No worrisome osseous findings are demonstrated.  IMPRESSION: 1. Findings are consistent with acute cholecystitis. There is no evidence of biliary obstruction. 2. No evidence of appendicitis. 3. Sigmoid diverticulosis without evidence of acute inflammation. 4. Possible nonobstructing left renal calculi. 5. These results were called by telephone at the time of interpretation on 08/31/2013 at 4:28 PM to Dr. Marjory LiesBRENT BURNETT , who verbally acknowledged these results.   Electronically Signed   By: Roxy HorsemanBill  Veazey M.D.   On: 08/31/2013 16:28    Anti-infectives: Anti-infectives   Start     Dose/Rate Route Frequency Ordered Stop   08/31/13 2200  Ampicillin-Sulbactam (UNASYN) 3 g in sodium chloride 0.9 % 100 mL IVPB     3 g 100 mL/hr over  60 Minutes Intravenous Every 6 hours 08/31/13 2017        Assessment/Plan: s/p Procedure(s): LAPAROSCOPIC CHOLECYSTECTOMY WITH INTRAOPERATIVE CHOLANGIOGRAM (N/A) npo Plan lap chole this PM vs tomorrow.    Discussed surgery and risks.   LOS: 1 day    Ernest Horton 09/01/2013

## 2013-09-01 NOTE — Progress Notes (Signed)
Patient is alert and oriented, vital signs are stable, patient with no complaints of pain or discomfort this shift no prns given, patient to go for surgery tomorrow morning wife informed, wife at bedside and supportive, patient on clear liquid diet and npo after midnight tonight, will continue to monitor Stanford BreedKennitrish N Jahmya Onofrio RN 09-01-2013 18:54pm

## 2013-09-02 ENCOUNTER — Encounter (HOSPITAL_COMMUNITY): Admission: EM | Disposition: A | Discharge status: Home / Self Care | Payer: Self-pay | Source: Home / Self Care

## 2013-09-02 ENCOUNTER — Inpatient Hospital Stay (HOSPITAL_COMMUNITY): Payer: Medicare HMO | Admitting: *Deleted

## 2013-09-02 ENCOUNTER — Inpatient Hospital Stay (HOSPITAL_COMMUNITY): Payer: Medicare HMO

## 2013-09-02 ENCOUNTER — Encounter (HOSPITAL_COMMUNITY): Payer: Self-pay | Admitting: General Surgery

## 2013-09-02 ENCOUNTER — Encounter (HOSPITAL_COMMUNITY): Payer: Medicare HMO | Admitting: *Deleted

## 2013-09-02 DIAGNOSIS — K8000 Calculus of gallbladder with acute cholecystitis without obstruction: Secondary | ICD-10-CM

## 2013-09-02 HISTORY — PX: CHOLECYSTECTOMY: SHX55

## 2013-09-02 SURGERY — LAPAROSCOPIC CHOLECYSTECTOMY WITH INTRAOPERATIVE CHOLANGIOGRAM
Anesthesia: General | Site: Abdomen

## 2013-09-02 MED ORDER — HYDROMORPHONE HCL PF 1 MG/ML IJ SOLN
0.2500 mg | INTRAMUSCULAR | Status: DC | PRN
  Administered 2013-09-02 (×4): 0.5 mg via INTRAVENOUS

## 2013-09-02 MED ORDER — PROPOFOL 10 MG/ML IV BOLUS
INTRAVENOUS | Status: AC
  Filled 2013-09-02: qty 20

## 2013-09-02 MED ORDER — HYDROMORPHONE HCL PF 1 MG/ML IJ SOLN
INTRAMUSCULAR | Status: AC
  Filled 2013-09-02: qty 1

## 2013-09-02 MED ORDER — MIDAZOLAM HCL 2 MG/2ML IJ SOLN
INTRAMUSCULAR | Status: AC
  Filled 2013-09-02: qty 2

## 2013-09-02 MED ORDER — ONDANSETRON HCL 4 MG/2ML IJ SOLN
INTRAMUSCULAR | Status: AC
  Filled 2013-09-02: qty 2

## 2013-09-02 MED ORDER — LABETALOL HCL 5 MG/ML IV SOLN
INTRAVENOUS | Status: DC | PRN
  Administered 2013-09-02 (×2): 2.5 mg via INTRAVENOUS

## 2013-09-02 MED ORDER — MIDAZOLAM HCL 5 MG/5ML IJ SOLN
INTRAMUSCULAR | Status: DC | PRN
  Administered 2013-09-02: 2 mg via INTRAVENOUS

## 2013-09-02 MED ORDER — FENTANYL CITRATE 0.05 MG/ML IJ SOLN
INTRAMUSCULAR | Status: AC
  Filled 2013-09-02: qty 2

## 2013-09-02 MED ORDER — ROCURONIUM BROMIDE 100 MG/10ML IV SOLN
INTRAVENOUS | Status: DC | PRN
  Administered 2013-09-02: 30 mg via INTRAVENOUS
  Administered 2013-09-02 (×2): 10 mg via INTRAVENOUS

## 2013-09-02 MED ORDER — FENTANYL CITRATE 0.05 MG/ML IJ SOLN
INTRAMUSCULAR | Status: DC | PRN
  Administered 2013-09-02: 50 ug via INTRAVENOUS
  Administered 2013-09-02: 100 ug via INTRAVENOUS
  Administered 2013-09-02 (×6): 50 ug via INTRAVENOUS

## 2013-09-02 MED ORDER — PROPOFOL 10 MG/ML IV BOLUS
INTRAVENOUS | Status: DC | PRN
  Administered 2013-09-02: 200 mg via INTRAVENOUS

## 2013-09-02 MED ORDER — GLYCOPYRROLATE 0.2 MG/ML IJ SOLN
INTRAMUSCULAR | Status: AC
  Filled 2013-09-02: qty 3

## 2013-09-02 MED ORDER — HYDROMORPHONE HCL PF 1 MG/ML IJ SOLN
INTRAMUSCULAR | Status: AC
Start: 2013-09-02 — End: 2013-09-03
  Filled 2013-09-02: qty 1

## 2013-09-02 MED ORDER — NEOSTIGMINE METHYLSULFATE 1 MG/ML IJ SOLN
INTRAMUSCULAR | Status: AC
  Filled 2013-09-02: qty 10

## 2013-09-02 MED ORDER — LACTATED RINGERS IR SOLN
Status: DC | PRN
  Administered 2013-09-02: 1000 mL

## 2013-09-02 MED ORDER — HYDROMORPHONE HCL PF 1 MG/ML IJ SOLN
0.2500 mg | INTRAMUSCULAR | Status: DC | PRN
  Administered 2013-09-02 (×5): 0.5 mg via INTRAVENOUS

## 2013-09-02 MED ORDER — HYDRALAZINE HCL 20 MG/ML IJ SOLN
INTRAMUSCULAR | Status: AC
  Filled 2013-09-02: qty 1

## 2013-09-02 MED ORDER — PROMETHAZINE HCL 25 MG/ML IJ SOLN: 6.2500 mg | INTRAMUSCULAR | Status: DC | PRN

## 2013-09-02 MED ORDER — SUCCINYLCHOLINE CHLORIDE 20 MG/ML IJ SOLN
INTRAMUSCULAR | Status: DC | PRN
  Administered 2013-09-02: 100 mg via INTRAVENOUS

## 2013-09-02 MED ORDER — IOHEXOL 300 MG/ML  SOLN
INTRAMUSCULAR | Status: DC | PRN
  Administered 2013-09-02: 9 mL

## 2013-09-02 MED ORDER — BUPIVACAINE-EPINEPHRINE (PF) 0.25% -1:200000 IJ SOLN
INTRAMUSCULAR | Status: DC | PRN
  Administered 2013-09-02: 10 mL

## 2013-09-02 MED ORDER — ONDANSETRON HCL 4 MG/2ML IJ SOLN
INTRAMUSCULAR | Status: DC | PRN
  Administered 2013-09-02: 4 mg via INTRAVENOUS

## 2013-09-02 MED ORDER — LIDOCAINE HCL (CARDIAC) 20 MG/ML IV SOLN
INTRAVENOUS | Status: DC | PRN
  Administered 2013-09-02: 100 mg via INTRAVENOUS

## 2013-09-02 MED ORDER — FENTANYL CITRATE 0.05 MG/ML IJ SOLN
INTRAMUSCULAR | Status: AC
  Filled 2013-09-02: qty 5

## 2013-09-02 MED ORDER — LIDOCAINE HCL (CARDIAC) 20 MG/ML IV SOLN
INTRAVENOUS | Status: AC
  Filled 2013-09-02: qty 5

## 2013-09-02 MED ORDER — SODIUM CHLORIDE 0.9 % IJ SOLN
INTRAMUSCULAR | Status: AC
  Filled 2013-09-02: qty 10

## 2013-09-02 MED ORDER — BUPIVACAINE-EPINEPHRINE (PF) 0.25% -1:200000 IJ SOLN
INTRAMUSCULAR | Status: AC
  Filled 2013-09-02: qty 30

## 2013-09-02 MED ORDER — HYDRALAZINE HCL 20 MG/ML IJ SOLN
INTRAMUSCULAR | Status: DC | PRN
  Administered 2013-09-02: 2.5 mg via INTRAVENOUS

## 2013-09-02 MED ORDER — LACTATED RINGERS IV SOLN: INTRAVENOUS | Status: DC

## 2013-09-02 MED ORDER — GLYCOPYRROLATE 0.2 MG/ML IJ SOLN
INTRAMUSCULAR | Status: DC | PRN
  Administered 2013-09-02: .6 mg via INTRAVENOUS

## 2013-09-02 MED ORDER — LACTATED RINGERS IV SOLN
INTRAVENOUS | Status: DC
  Administered 2013-09-02: 1000 mL via INTRAVENOUS

## 2013-09-02 MED ORDER — NEOSTIGMINE METHYLSULFATE 1 MG/ML IJ SOLN
INTRAMUSCULAR | Status: DC | PRN
  Administered 2013-09-02: 5 mg via INTRAVENOUS

## 2013-09-02 MED ORDER — LABETALOL HCL 5 MG/ML IV SOLN
INTRAVENOUS | Status: AC
  Filled 2013-09-02: qty 4

## 2013-09-02 MED ORDER — LIDOCAINE HCL (PF) 1 % IJ SOLN
INTRAMUSCULAR | Status: DC | PRN
  Administered 2013-09-02: 10 mL

## 2013-09-02 MED ORDER — ROCURONIUM BROMIDE 100 MG/10ML IV SOLN
INTRAVENOUS | Status: AC
  Filled 2013-09-02: qty 1

## 2013-09-02 SURGICAL SUPPLY — 39 items
APPLIER CLIP ROT 10 11.4 M/L (STAPLE) ×3
CANISTER SUCTION 2500CC (MISCELLANEOUS) IMPLANT
CHLORAPREP W/TINT 26ML (MISCELLANEOUS) ×3 IMPLANT
CLIP APPLIE ROT 10 11.4 M/L (STAPLE) ×1 IMPLANT
CLIP LIGATING HEMO O LOK GREEN (MISCELLANEOUS) IMPLANT
CONT SPECI 4OZ STER CLIK (MISCELLANEOUS) ×3 IMPLANT
COVER MAYO STAND STRL (DRAPES) ×3 IMPLANT
DECANTER SPIKE VIAL GLASS SM (MISCELLANEOUS) ×3 IMPLANT
DERMABOND ADVANCED (GAUZE/BANDAGES/DRESSINGS)
DERMABOND ADVANCED .7 DNX12 (GAUZE/BANDAGES/DRESSINGS) IMPLANT
DRAIN CHANNEL 19F RND (DRAIN) ×3 IMPLANT
DRAPE C-ARM 42X120 X-RAY (DRAPES) ×3 IMPLANT
DRAPE LAPAROSCOPIC ABDOMINAL (DRAPES) ×3 IMPLANT
DRAPE UTILITY XL STRL (DRAPES) ×3 IMPLANT
DRAPE WARM FLUID 44X44 (DRAPE) IMPLANT
ELECT REM PT RETURN 9FT ADLT (ELECTROSURGICAL) ×3
ELECTRODE REM PT RTRN 9FT ADLT (ELECTROSURGICAL) ×1 IMPLANT
EVACUATOR SILICONE 100CC (DRAIN) ×3 IMPLANT
GLOVE BIO SURGEON STRL SZ 6 (GLOVE) ×30 IMPLANT
GLOVE INDICATOR 6.5 STRL GRN (GLOVE) ×3 IMPLANT
GOWN SPEC L3 XXLG W/TWL (GOWN DISPOSABLE) ×9 IMPLANT
GOWN STRL REUS W/TWL XL LVL3 (GOWN DISPOSABLE) ×6 IMPLANT
HEMOSTAT SNOW SURGICEL 2X4 (HEMOSTASIS) IMPLANT
HEMOSTAT SURGICEL 4X8 (HEMOSTASIS) ×3 IMPLANT
KIT BASIN OR (CUSTOM PROCEDURE TRAY) ×3 IMPLANT
POUCH SPECIMEN RETRIEVAL 10MM (ENDOMECHANICALS) ×3 IMPLANT
SET CHOLANGIOGRAPH MIX (MISCELLANEOUS) ×3 IMPLANT
SET IRRIG TUBING LAPAROSCOPIC (IRRIGATION / IRRIGATOR) ×3 IMPLANT
SLEEVE XCEL OPT CAN 5 100 (ENDOMECHANICALS) ×3 IMPLANT
SOLUTION ANTI FOG 6CC (MISCELLANEOUS) ×3 IMPLANT
SUT ETHILON 2 0 PS N (SUTURE) ×3 IMPLANT
SUT MNCRL AB 4-0 PS2 18 (SUTURE) ×12 IMPLANT
TOWEL OR 17X26 10 PK STRL BLUE (TOWEL DISPOSABLE) ×3 IMPLANT
TOWEL OR NON WOVEN STRL DISP B (DISPOSABLE) ×3 IMPLANT
TRAY LAP CHOLE (CUSTOM PROCEDURE TRAY) ×3 IMPLANT
TROCAR BLADELESS OPT 5 75 (ENDOMECHANICALS) ×6 IMPLANT
TROCAR XCEL BLUNT TIP 100MML (ENDOMECHANICALS) ×3 IMPLANT
TROCAR XCEL NON-BLD 11X100MML (ENDOMECHANICALS) ×3 IMPLANT
TUBING INSUFFLATION 10FT LAP (TUBING) ×3 IMPLANT

## 2013-09-02 NOTE — ED Provider Notes (Signed)
Medical screening examination/treatment/procedure(s) were performed by non-physician practitioner and as supervising physician I was immediately available for consultation/collaboration.   EKG Interpretation None        Gavin PoundMichael Y. Jahmya Onofrio, MD 09/02/13 0000

## 2013-09-02 NOTE — Op Note (Signed)
Laparoscopic Cholecystectomy with IOC Procedure Note  Indications: This patient presents with acute cholecystitis and will undergo laparoscopic cholecystectomy.  Pre-operative Diagnosis: acute cholecystitis with calculous  Post-operative Diagnosis: Same + choledocholithiasis  Surgeon: Almond LintBYERLY,Dhriti Fales   Assistants: Claud KelpINGRAM, HAYWOOD  Anesthesia: General endotracheal anesthesia and local  ASA Class: 2  Procedure Details  The patient was seen again in the Holding Room. The risks, benefits, complications, treatment options, and expected outcomes were discussed with the patient. The possibilities of  bleeding, recurrent infection, damage to nearby structures, the need for additional procedures, failure to diagnose a condition, the possible need to convert to an open procedure, and creating a complication requiring transfusion or operation were discussed with the patient. The likelihood of improving the patient's symptoms with return to their baseline status is good.    The patient and/or family concurred with the proposed plan, giving informed consent. The site of surgery properly noted. The patient was taken to Operating Room, and the procedure verified as Laparoscopic Cholecystectomy with Intraoperative Cholangiogram. A Time Out was held and the above information confirmed.  Prior to the induction of general anesthesia, antibiotic prophylaxis was administered. General endotracheal anesthesia was then administered and tolerated well. After the induction, the abdomen was prepped with Chloraprep and draped in the sterile fashion. The patient was positioned in the supine position.  Local anesthetic agent was injected into the skin near the umbilicus and an incision made. We dissected down to the abdominal fascia with blunt dissection.  The fascia was incised vertically and we entered the peritoneal cavity bluntly.  A pursestring suture of 0-Vicryl was placed around the fascial opening.  The Hasson cannula  was inserted and secured with the stay suture.  Pneumoperitoneum was then created with CO2 and tolerated well without any adverse changes in the patient's vital signs. An 11-mm port was placed in the subxiphoid position.  Two 5-mm ports were placed in the right upper quadrant. All skin incisions were infiltrated with a local anesthetic agent before making the incision and placing the trocars.   We positioned the patient in reverse Trendelenburg, tilted slightly to the patient's left.  The gallbladder was stuck underneath the omentum.  It was extremely inflamed.  The omentum was bluntly dissected away from the omentum.  The gallbladder was identified.  The Nezhat suction was used to aspirate the gallbladder.  The fundus was grasped and retracted cephalad. Adhesions were lysed bluntly and with the electrocautery where indicated, taking care not to injure any adjacent organs or viscus. The gallbladder was started to be dissected dome down due to the severity of the inflammation.  The suction was then used to work with the infundibulum some more, and the ductal structures were able to be identified.  The cystic duct and artery were skeletonized.  A critical view of the cystic duct and cystic artery was obtained.  The cystic duct was clearly identified and bluntly dissected circumferentially. The cystic duct was ligated with a clip distally.   An incision was made in the cystic duct and the [facility] chol[facility] catheter introduced. The catheter was secured using a clip. A cholangiogram was then performed, demonstrating filling of the right and left hepatic duct, common duct, and the duodenum.  There were filling defects distally in the duct.  The cystic duct was then ligated with clips and divided. The cystic artery was identified, dissected free, ligated with clips and divided as well.   The gallbladder was dissected from the liver bed in retrograde fashion with the  electrocautery. The gallbladder was removed and  placed in an Endocatch bag.  The gallbladder and Endocatch bag were then removed through the umbilical port site.  The liver bed was irrigated and inspected. Hemostasis was achieved with the electrocautery. Copious irrigation was utilized and was repeatedly aspirated until clear.  Surgicel was placed on the hepatic fossa.  A drain was placed in the RUQ.    We again inspected the right upper quadrant for hemostasis.  Pneumoperitoneum was released as we removed the trocars.   The pursestring suture was used to close the umbilical fascia.  4-0 Monocryl was used to close the skin.   The skin was cleaned and dry, and Dermabond was applied. The patient was then extubated and brought to the recovery room in stable condition. Instrument, sponge, and needle counts were correct at closure and at the conclusion of the case.   Findings: Severe acute cholecystitis.    Estimated Blood Loss: 100         Drains: 19 Fr Blake drain          Specimens: Gallbladder to pathology       Complications: None; patient tolerated the procedure well.         Disposition: PACU - hemodynamically stable.         Condition: stable

## 2013-09-02 NOTE — Anesthesia Preprocedure Evaluation (Addendum)
Anesthesia Evaluation  Patient identified by MRN, date of birth, ID band Patient awake    Reviewed: Allergy & Precautions, H&P , NPO status , Patient's Chart, lab work & pertinent test results  Airway Mallampati: II TM Distance: >3 FB Neck ROM: Full    Dental  (+) Edentulous Upper, Edentulous Lower   Pulmonary shortness of breath and with exertion, Current Smoker,  breath sounds clear to auscultation  Pulmonary exam normal       Cardiovascular negative cardio ROS  Rhythm:Regular Rate:Normal  Hx of pericarditis 1980   Neuro/Psych Anxiety negative neurological ROS  negative psych ROS   GI/Hepatic negative GI ROS, Neg liver ROS,   Endo/Other  negative endocrine ROS  Renal/GU Renal disease  negative genitourinary   Musculoskeletal negative musculoskeletal ROS (+)   Abdominal   Peds negative pediatric ROS (+)  Hematology negative hematology ROS (+)   Anesthesia Other Findings   Reproductive/Obstetrics                        Anesthesia Physical Anesthesia Plan  ASA: II  Anesthesia Plan: General   Post-op Pain Management:    Induction: Intravenous  Airway Management Planned: Oral ETT  Additional Equipment:   Intra-op Plan:   Post-operative Plan: Extubation in OR  Informed Consent: I have reviewed the patients History and Physical, chart, labs and discussed the procedure including the risks, benefits and alternatives for the proposed anesthesia with the patient or authorized representative who has indicated his/her understanding and acceptance.   Dental advisory given  Plan Discussed with: CRNA  Anesthesia Plan Comments:         Anesthesia Quick Evaluation

## 2013-09-02 NOTE — Progress Notes (Signed)
Patient ID: Ernest Horton, male   DOB: 06/29/1945, 68 y.o.   MRN: [id number]    Subjective: Pt feels ok.  Still with some pain.    Objective: Vital signs in last 24 hours: Temp:  [97.9 F (36.6 C)-98.1 F (36.7 C)] 97.9 F (36.6 C) (04/22 0618) Pulse Rate:  [79-85] 81 (04/22 0618) Resp:  [20] 20 (04/22 0618) BP: (121-143)/(75-83) 121/81 mmHg (04/22 0618) SpO2:  [92 %-96 %] 92 % (04/22 0618)    Intake/Output from previous day: 04/21 0701 - 04/22 0700 In: 2640 [P.O.:240; I.V.:2400] Out: 3100 [Urine:3100] Intake/Output this shift:    PE: Abd: soft, tender in RUQ, +BS Heart: regular  Lab Results:   Recent Labs  08/31/13 1830  WBC 16.0*  HGB 14.8  HCT 42.5  PLT 329   BMET  Recent Labs  08/31/13 1538 08/31/13 1830  NA  --  135*  K  --  4.4  CL  --  97  CO2  --  26  GLUCOSE  --  89  BUN  --  12  CREATININE 1.10 0.95  CALCIUM  --  9.4   PT/INR  Recent Labs  08/31/13 1925  LABPROT 13.6  INR 1.06   CMP     Component Value Date/Time   NA 135* 08/31/2013 1830   K 4.4 08/31/2013 1830   CL 97 08/31/2013 1830   CO2 26 08/31/2013 1830   GLUCOSE 89 08/31/2013 1830   BUN 12 08/31/2013 1830   CREATININE 0.95 08/31/2013 1830   CALCIUM 9.4 08/31/2013 1830   PROT 7.4 08/31/2013 1830   ALBUMIN 3.2* 08/31/2013 1830   AST 29 08/31/2013 1830   ALT 70* 08/31/2013 1830   ALKPHOS 113 08/31/2013 1830   BILITOT 0.4 08/31/2013 1830   GFRNONAA 85* 08/31/2013 1830   GFRAA >90 08/31/2013 1830   Lipase  No results found for this basename: lipase       Studies/Results: Ct Abdomen Pelvis W Contrast  08/31/2013   CLINICAL DATA:  Right lower quadrant pain and some left lower quadrant pain. History of diarrhea last week. No previous relevant surgery.  EXAM: CT ABDOMEN AND PELVIS WITH CONTRAST  TECHNIQUE: Multidetector CT imaging of the abdomen and pelvis was performed using the standard protocol following bolus administration of intravenous contrast.  CONTRAST:  80mL OMNIPAQUE IOHEXOL  300 MG/ML  SOLN  COMPARISON:  MR L SPINE WO/W CM dated 03/30/2011  FINDINGS: The lung bases are essentially clear. There is no pleural or pericardial effusion. Coronary artery calcifications and a small hiatal hernia are noted.  The gallbladder demonstrates irregular wall thickening with surrounding inflammatory change suspicious for cholecystitis. There are multiple calcified and noncalcified gallstones, including 1 measuring 13 mm in the gallbladder neck. There is no intra or extrahepatic biliary dilatation. No calcified common duct stones are seen.  The liver, spleen, pancreas and adrenal glands appear normal. There are possible tiny nonobstructing left renal calculi. The right kidney appears normal. There is no hydronephrosis.  The stomach and small bowel appear normal. There is a retrocecal appendix which is normal in caliber without surrounding inflammation. There are sigmoid colon diverticular changes without surrounding inflammation.  There is mild aortoiliac atherosclerosis and moderate enlargement of the prostate gland. The seminal vesicles and urinary bladder appear normal.  There is lower lumbar spondylosis with advanced disc space loss at L4-5 and L5-S1. No worrisome osseous findings are demonstrated.  IMPRESSION: 1. Findings are consistent with acute cholecystitis. There is no evidence of biliary obstruction. 2.  No evidence of appendicitis. 3. Sigmoid diverticulosis without evidence of acute inflammation. 4. Possible nonobstructing left renal calculi. 5. These results were called by telephone at the time of interpretation on 08/31/2013 at 4:28 PM to Dr. Marjory LiesBRENT BURNETT , who verbally acknowledged these results.   Electronically Signed   By: Roxy HorsemanBill  Veazey M.D.   On: 08/31/2013 16:28    Anti-infectives: Anti-infectives   Start     Dose/Rate Route Frequency Ordered Stop   08/31/13 2200  Ampicillin-Sulbactam (UNASYN) 3 g in sodium chloride 0.9 % 100 mL IVPB     3 g 100 mL/hr over 60 Minutes Intravenous  Every 6 hours 08/31/13 2017         Assessment/Plan  1. Acute cholecystitis  Plan: 1. To OR today   LOS: 2 days    Letha CapeKelly E Tanganika Barradas 09/02/2013, 8:39 AM Pager: [phone]

## 2013-09-02 NOTE — Consult Note (Signed)
Reason for Consult: Choledocholithiasis Referring Physician: CCS  Chirag Myles HPI: This is a 68 year old male who was admitted for complaints of RUQ that radiated down to his periumbilical region. His pain start last week when he was in [place].  The pain continued to persist upon his return home this weekend and he was subsequently admitted. Further evaluation revealed that he had an acute cholecystitis and during the operation the IOC revealed retained stones in the CBD.  GI was consulted for further treatment.  Past Medical History  Diagnosis Date  . Chest pressure     EXERTIONAL  . SOB (shortness of breath)   . Tobacco dependence   . Arthritis   . Anxiety   . Pericarditis 1980  . Diverticulosis of sigmoid colon 08/31/2013  . Renal calculus, left 08/31/2013    CT scan 2015   . Lumbar spondylolysis     L4-S1    Past Surgical History  Procedure Laterality Date  . Back surgery  1975    laminectomy  . Colonoscopy      Family History  Problem Relation Age of Onset  . GI problems Mother     ESOPHAGEAL PROBLEMS  . Cancer Father     PROSTATE    Social History:  reports that he has been smoking Cigarettes.  He has a 120 pack-year smoking history. He quit smokeless tobacco use about 50 years ago. His smokeless tobacco use included Snuff. He reports that he drinks alcohol. He reports that he does not use illicit drugs.  Allergies: No Known Allergies  Medications:  Scheduled: . ampicillin-sulbactam (UNASYN) IV  3 g Intravenous Q6H  . citalopram  20 mg Oral Daily  . docusate sodium  100 mg Oral QHS  . heparin  5,000 Units Subcutaneous 3 times per day  . HYDROmorphone      . HYDROmorphone      . HYDROmorphone      . HYDROmorphone      . HYDROmorphone      . lactated ringers  1,000 mL Intravenous Once  . lip balm  1 application Topical BID  . loratadine  10 mg Oral Daily  . psyllium  1 packet Oral BID  . saccharomyces boulardii  250 mg Oral BID  . traZODone  200 mg Oral  QHS  . venlafaxine XR  225 mg Oral Q breakfast  . vitamin E  400 Units Oral Daily   Continuous: . dextrose 5 % and 0.45 % NaCl with KCl 40 mEq/L Stopped (09/02/13 0947)    No results found for this or any previous visit (from the past 24 hour(s)).   Dg Cholangiogram Operative  09/02/2013   CLINICAL DATA:  Intraoperative cholangiogram.  EXAM: INTRAOPERATIVE CHOLANGIOGRAM  TECHNIQUE: Cholangiographic images from the C-arm fluoroscopic device were submitted for interpretation post-operatively. Please see the procedural report for the amount of contrast and the fluoroscopy time utilized.  COMPARISON:  CT abdomen pelvis 08/31/2013.  FINDINGS: Interoperative cholangiogram shows opacification of the cystic duct and biliary tree with lucent air bubbles seen traveling non dependently with additional filling defects seen dependently in the lower common bile duct. Common bile duct may be mildly dilated. Contrast flows readily into the duodenum. Contrast is also seen within the colon.  IMPRESSION: Stones within the lower common bile duct. Common bile duct appears mildly dilated. Findings were discussed with Dr. Byerly prior to dictation.   Electronically Signed   By: Melinda  Blietz M.D.   On: 09/02/2013 13:25      ROS:  As stated above in the HPI otherwise negative.  Blood pressure 106/68, pulse 74, temperature 97.7 F (36.5 C), temperature source Oral, resp. rate 20, height 5\' 10"  (1.778 m), weight 188 lb (85.276 kg), SpO2 93.00%.    PE: Gen: NAD, Alert and Oriented HEENT:  /AT, EOMI Neck: Supple, no LAD Lungs: CTA Bilaterally CV: RRR without M/G/R ABM: Soft, tender at the incision sites, +BS Ext: No C/C/E  Assessment/Plan: 1) Choledocholithiasis. 2) Severe acute cholecystitis s/p lap chole.   I discussed the procedure with the patient and his wife.  I discussed the risk of pancreatitis and the possibility of bleeding.  The acknowledge the risks and want to proceed.  Plan: 1) ERCP  tomorrow.  Theda Belfastatrick D Norie Latendresse 09/02/2013, 5:14 PM

## 2013-09-02 NOTE — Progress Notes (Signed)
Seen, agree with above.  Lap chole with c-gram planned.

## 2013-09-02 NOTE — Anesthesia Postprocedure Evaluation (Signed)
Anesthesia Post Note  Patient: Ernest Horton  Procedure(s) Performed: Procedure(s) (LRB): LAPAROSCOPIC CHOLECYSTECTOMY WITH INTRAOPERATIVE CHOLANGIOGRAM (N/A)  Anesthesia type: General  Patient location: PACU  Post pain: Pain level controlled  Post assessment: Post-op Vital signs reviewed  Last Vitals:  Filed Vitals:   09/02/13 1635  BP: 106/68  Pulse: 74  Temp: 36.5 C  Resp: 20    Post vital signs: Reviewed  Level of consciousness: sedated  Complications: No apparent anesthesia complications

## 2013-09-02 NOTE — Progress Notes (Signed)
Pt unable to void post op. Bladder scan showed 503ml. Foley inserted.  Ernest BoutonAmanda King Horton

## 2013-09-02 NOTE — Transfer of Care (Signed)
Immediate Anesthesia Transfer of Care Note  Patient: Ernest Horton  Procedure(s) Performed: Procedure(s): LAPAROSCOPIC CHOLECYSTECTOMY WITH INTRAOPERATIVE CHOLANGIOGRAM (N/A)  Patient Location: PACU  Anesthesia Type:General  Level of Consciousness: awake, alert , oriented and patient cooperative  Airway & Oxygen Therapy: Patient Spontanous Breathing and Patient connected to face mask oxygen  Post-op Assessment: Report given to PACU RN, Post -op Vital signs reviewed and stable and Patient moving all extremities  Post vital signs: Reviewed and stable  Complications: No apparent anesthesia complications

## 2013-09-03 ENCOUNTER — Encounter (HOSPITAL_COMMUNITY): Payer: Self-pay | Admitting: General Surgery

## 2013-09-03 ENCOUNTER — Encounter (HOSPITAL_COMMUNITY): Admission: EM | Disposition: A | Discharge status: Home / Self Care | Payer: Self-pay | Source: Home / Self Care

## 2013-09-03 ENCOUNTER — Inpatient Hospital Stay (HOSPITAL_COMMUNITY): Payer: Medicare HMO

## 2013-09-03 HISTORY — PX: ERCP: SHX5425

## 2013-09-03 LAB — COMPREHENSIVE METABOLIC PANEL
ALBUMIN: 2.6 g/dL — AB (ref 3.5–5.2)
ALK PHOS: 94 U/L (ref 39–117)
ALT: 72 U/L — ABNORMAL HIGH (ref 0–53)
AST: 44 U/L — ABNORMAL HIGH (ref 0–37)
BUN: 11 mg/dL (ref 6–23)
CHLORIDE: 101 meq/L (ref 96–112)
CO2: 27 mEq/L (ref 19–32)
Calcium: 8.5 mg/dL (ref 8.4–10.5)
Creatinine, Ser: 1 mg/dL (ref 0.50–1.35)
GFR calc Af Amer: 89 mL/min — ABNORMAL LOW (ref >90)
GFR calc non Af Amer: 76 mL/min — ABNORMAL LOW (ref >90)
Glucose, Bld: 115 mg/dL — ABNORMAL HIGH (ref 70–99)
POTASSIUM: 4.2 meq/L (ref 3.7–5.3)
SODIUM: 138 meq/L (ref 137–147)
Total Bilirubin: <0.2 mg/dL — ABNORMAL LOW (ref 0.3–1.2)
Total Protein: 6 g/dL (ref 6.0–8.3)

## 2013-09-03 SURGERY — ERCP, WITH INTERVENTION IF INDICATED
Anesthesia: Moderate Sedation

## 2013-09-03 MED ORDER — MIDAZOLAM HCL 10 MG/2ML IJ SOLN
INTRAMUSCULAR | Status: DC | PRN
  Administered 2013-09-03 (×2): 2 mg via INTRAVENOUS
  Administered 2013-09-03: 1 mg via INTRAVENOUS

## 2013-09-03 MED ORDER — MIDAZOLAM HCL 10 MG/2ML IJ SOLN
INTRAMUSCULAR | Status: AC
  Filled 2013-09-03: qty 4

## 2013-09-03 MED ORDER — GLUCAGON HCL (RDNA) 1 MG IJ SOLR
INTRAMUSCULAR | Status: AC
  Filled 2013-09-03: qty 2

## 2013-09-03 MED ORDER — OXYCODONE-ACETAMINOPHEN 5-325 MG PO TABS
1.0000 | ORAL_TABLET | ORAL | Status: DC | PRN
  Administered 2013-09-03 (×2): 1 via ORAL
  Administered 2013-09-04 (×4): 2 via ORAL
  Administered 2013-09-05 (×2): 1 via ORAL
  Filled 2013-09-03 (×2): qty 2
  Filled 2013-09-03: qty 1
  Filled 2013-09-03: qty 2
  Filled 2013-09-03 (×2): qty 1
  Filled 2013-09-03: qty 2
  Filled 2013-09-03: qty 1

## 2013-09-03 MED ORDER — BUTAMBEN-TETRACAINE-BENZOCAINE 2-2-14 % EX AERO
INHALATION_SPRAY | CUTANEOUS | Status: DC | PRN
  Administered 2013-09-03: 2 via TOPICAL

## 2013-09-03 MED ORDER — GLUCAGON HCL (RDNA) 1 MG IJ SOLR
INTRAMUSCULAR | Status: DC | PRN
  Administered 2013-09-03: .5 mg via INTRAVENOUS

## 2013-09-03 MED ORDER — SODIUM CHLORIDE 0.9 % IV SOLN
INTRAVENOUS | Status: DC
  Administered 2013-09-03: 14:00:00 via INTRAVENOUS

## 2013-09-03 MED ORDER — FENTANYL CITRATE 0.05 MG/ML IJ SOLN
INTRAMUSCULAR | Status: AC
  Filled 2013-09-03: qty 4

## 2013-09-03 MED ORDER — FENTANYL CITRATE 0.05 MG/ML IJ SOLN
INTRAMUSCULAR | Status: DC | PRN
  Administered 2013-09-03 (×3): 25 ug via INTRAVENOUS

## 2013-09-03 MED ORDER — SODIUM CHLORIDE 0.9 % IV SOLN
INTRAVENOUS | Status: DC | PRN
  Administered 2013-09-03: 16:00:00

## 2013-09-03 MED ORDER — DIPHENHYDRAMINE HCL 50 MG/ML IJ SOLN
INTRAMUSCULAR | Status: AC
  Filled 2013-09-03: qty 1

## 2013-09-03 SURGICAL SUPPLY — 1 items: Advanix biliary stent ×3 IMPLANT

## 2013-09-03 NOTE — H&P (View-Only) (Signed)
Reason for Consult: Choledocholithiasis Referring Physician: CCS  Myer HaffPaul Chenevert HPI: This is a 68 year old male who was admitted for complaints of RUQ that radiated down to his periumbilical region. His pain start last [place] when [place] was in [place].  The pain continued to persist upon his return home this weekend and he was subsequently admitted. Further evaluation revealed that he had an acute cholecystitis and during the operation the IOC revealed retained stones in the CBD.  GI was consulted for further treatment.  Past Medical History  Diagnosis Date  . Chest pressure     EXERTIONAL  . SOB (shortness of breath)   . Tobacco dependence   . Arthritis   . Anxiety   . Pericarditis 1980  . Diverticulosis of sigmoid colon 08/31/2013  . Renal calculus, left 08/31/2013    CT scan 2015   . Lumbar spondylolysis     L4-S1    Past Surgical History  Procedure Laterality Date  . Back surgery  1975    laminectomy  . Colonoscopy      Family History  Problem Relation Age of Onset  . GI problems Mother     ESOPHAGEAL PROBLEMS  . Cancer Father     PROSTATE    Social History:  reports that he has been smoking Cigarettes.  He has a 120 pack-year smoking history. He quit smokeless tobacco use about 50 years ago. His smokeless tobacco use included Snuff. He reports that he drinks alcohol. He reports that he does not use illicit drugs.  Allergies: No Known Allergies  Medications:  Scheduled: . ampicillin-sulbactam (UNASYN) IV  3 g Intravenous Q6H  . citalopram  20 mg Oral Daily  . docusate sodium  100 mg Oral QHS  . heparin  5,000 Units Subcutaneous 3 times per day  . HYDROmorphone      . HYDROmorphone      . HYDROmorphone      . HYDROmorphone      . HYDROmorphone      . lactated ringers  1,000 mL Intravenous Once  . lip balm  1 application Topical BID  . loratadine  10 mg Oral Daily  . psyllium  1 packet Oral BID  . saccharomyces boulardii  250 mg Oral BID  . traZODone  200 mg Oral  QHS  . venlafaxine XR  225 mg Oral Q breakfast  . vitamin E  400 Units Oral Daily   Continuous: . dextrose 5 % and 0.45 % NaCl with KCl 40 mEq/L Stopped (09/02/13 0947)    No results found for this or any previous visit (from the past 24 hour(s)).   Dg Cholangiogram Operative  09/02/2013   CLINICAL DATA:  Intraoperative cholangiogram.  EXAM: INTRAOPERATIVE CHOLANGIOGRAM  TECHNIQUE: Cholangiographic images from the C-arm fluoroscopic device were submitted for interpretation post-operatively. Please see the procedural report for the amount of contrast and the fluoroscopy time utilized.  COMPARISON:  CT abdomen pelvis 08/31/2013.  FINDINGS: Interoperative cholangiogram shows opacification of the cystic duct and biliary tree with lucent air bubbles seen traveling non dependently with additional filling defects seen dependently in the lower common bile duct. Common bile duct may be mildly dilated. Contrast flows readily into the duodenum. Contrast is also seen within the colon.  IMPRESSION: Stones within the lower common bile duct. Common bile duct appears mildly dilated. Findings were discussed with Dr. Donell BeersByerly prior to dictation.   Electronically Signed   By: Leanna BattlesMelinda  Blietz M.D.   On: 09/02/2013 13:25  ROS:  As stated above in the HPI otherwise negative.  Blood pressure 106/68, pulse 74, temperature 97.7 F (36.5 C), temperature source Oral, resp. rate 20, height 5\' 10"  (1.778 m), weight 188 lb (85.276 kg), SpO2 93.00%.    PE: Gen: NAD, Alert and Oriented HEENT:  /AT, EOMI Neck: Supple, no LAD Lungs: CTA Bilaterally CV: RRR without M/G/R ABM: Soft, tender at the incision sites, +BS Ext: No C/C/E  Assessment/Plan: 1) Choledocholithiasis. 2) Severe acute cholecystitis s/p lap chole.   I discussed the procedure with the patient and his wife.  I discussed the risk of pancreatitis and the possibility of bleeding.  The acknowledge the risks and want to proceed.  Plan: 1) ERCP  tomorrow.  Theda Belfastatrick D Leonila Speranza 09/02/2013, 5:14 PM

## 2013-09-03 NOTE — Progress Notes (Signed)
1 Day Post-Op  Subjective: He is still very sore and tender.  Feels warm.   He had pain medicine about 1 hour ago but needs more now.  Drain is bloody, they say it's been emptied, I stripped drain and see nothing but old blood.  NPO for ERCP later today.  Objective: Vital signs in last 24 hours: Temp:  [97.6 F (36.4 C)-98.3 F (36.8 C)] 97.8 F (36.6 C) (04/23 0605) Pulse Rate:  [74-100] 95 (04/23 0605) Resp:  [15-25] 16 (04/23 0605) BP: (94-137)/(42-79) 120/79 mmHg (04/23 0605) SpO2:  [93 %-100 %] 93 % (04/23 0605)   NPO now, Afebrile, VSS LFT's stable   Intake/Output from previous day: 04/22 0701 - 04/23 0700 In: 3146.7 [P.O.:360; I.V.:1986.7; IV Piggyback:800] Out: 925 [Urine:850; Drains:35; Blood:40] Intake/Output this shift: Total I/O In: -  Out: 210 [Urine:200; Drains:10]  General appearance: alert, cooperative and no distress Resp: clear to auscultation bilaterally GI: soft, he is really sore and tender, no distension port sites look fine.  Lab Results:   Recent Labs  08/31/13 1830  WBC 16.0*  HGB 14.8  HCT 42.5  PLT 329    BMET  Recent Labs  08/31/13 1830 09/03/13 0425  NA 135* 138  K 4.4 4.2  CL 97 101  CO2 26 27  GLUCOSE 89 115*  BUN 12 11  CREATININE 0.95 1.00  CALCIUM 9.4 8.5   PT/INR  Recent Labs  08/31/13 1925  LABPROT 13.6  INR 1.06     Recent Labs Lab 08/31/13 1830 09/03/13 0425  AST 29 44*  ALT 70* 72*  ALKPHOS 113 94  BILITOT 0.4 <0.2*  PROT 7.4 6.0  ALBUMIN 3.2* 2.6*     Lipase  No results found for this basename: lipase     Studies/Results: Dg Cholangiogram Operative  09/02/2013   CLINICAL DATA:  Intraoperative cholangiogram.  EXAM: INTRAOPERATIVE CHOLANGIOGRAM  TECHNIQUE: Cholangiographic images from the C-arm fluoroscopic device were submitted for interpretation post-operatively. Please see the procedural report for the amount of contrast and the fluoroscopy time utilized.  COMPARISON:  CT abdomen pelvis  08/31/2013.  FINDINGS: Interoperative cholangiogram shows opacification of the cystic duct and biliary tree with lucent air bubbles seen traveling non dependently with additional filling defects seen dependently in the lower common bile duct. Common bile duct may be mildly dilated. Contrast flows readily into the duodenum. Contrast is also seen within the colon.  IMPRESSION: Stones within the lower common bile duct. Common bile duct appears mildly dilated. Findings were discussed with Dr. Donell BeersByerly prior to dictation.   Electronically Signed   By: Leanna BattlesMelinda  Blietz M.D.   On: 09/02/2013 13:25    Medications: . ampicillin-sulbactam (UNASYN) IV  3 g Intravenous Q6H  . citalopram  20 mg Oral Daily  . docusate sodium  100 mg Oral QHS  . heparin  5,000 Units Subcutaneous 3 times per day  . lactated ringers  1,000 mL Intravenous Once  . lip balm  1 application Topical BID  . loratadine  10 mg Oral Daily  . psyllium  1 packet Oral BID  . saccharomyces boulardii  250 mg Oral BID  . traZODone  200 mg Oral QHS  . venlafaxine XR  225 mg Oral Q breakfast  . vitamin E  400 Units Oral Daily   . dextrose 5 % and 0.45 % NaCl with KCl 40 mEq/L 100 mL/hr at 09/03/13 0555   Prior to Admission medications   Medication Sig Start Date End Date Taking? Authorizing Provider  aspirin EC 81 MG tablet Take 81 mg by mouth at bedtime.   Yes Historical Provider, MD  citalopram (CELEXA) 20 MG tablet Take 20 mg by mouth daily.   Yes Historical Provider, MD  docusate sodium (COLACE) 100 MG capsule Take 100 mg by mouth at bedtime.   Yes Historical Provider, MD  fexofenadine (ALLEGRA) 180 MG tablet Take 180 mg by mouth at bedtime.   Yes Historical Provider, MD  MULTIPLE VITAMIN PO Take 1 tablet by mouth daily.   Yes Historical Provider, MD  traZODone (DESYREL) 100 MG tablet Take 200 mg by mouth at bedtime.   Yes Historical Provider, MD  venlafaxine XR (EFFEXOR-XR) 75 MG 24 hr capsule Take 225 mg by mouth daily with breakfast.    Yes Historical Provider, MD  vitamin E 400 UNIT capsule Take 400 Units by mouth daily.   Yes Historical Provider, MD     Assessment/Plan 1. Acute cholecystitis;  S/p Laparoscopic Cholecystectomy with IOC , 09/02/13, Dr. Donell BeersByerly  IOC shows stones in lower CBD 2.  Hx of tobacco use 3.  hx of anxiety ( on 3 medicines) 4.  Hx of left renal stone 5.  Hx of lumbar spondylolsis  Plan:  He is npo for procedure, but start on clears if ok after ERCP, continue antibiotics, restart his PO meds from home later today after ERCP.  They are already ordered.       LOS: 3 days    Sherrie GeorgeWillard Lissy Deuser 09/03/2013

## 2013-09-03 NOTE — Addendum Note (Signed)
Addendum created 09/03/13 0856 by Elisabeth CaraLacey A Constantina Laseter, CRNA   Modules edited: Anesthesia Medication Administration

## 2013-09-03 NOTE — Interval H&P Note (Signed)
History and Physical Interval Note:  09/03/2013 2:33 PM  Ernest Horton  has presented today for surgery, with the diagnosis of filling defect  The various methods of treatment have been discussed with the patient and family. After consideration of risks, benefits and other options for treatment, the patient has consented to  Procedure(s): ENDOSCOPIC RETROGRADE CHOLANGIOPANCREATOGRAPHY (ERCP) (N/A) as a surgical intervention .  The patient's history has been reviewed, patient examined, no change in status, stable for surgery.  I have reviewed the patient's chart and labs.  Questions were answered to the patient's satisfaction.     Theda BelfastPatrick D Tonyia Marschall

## 2013-09-03 NOTE — Progress Notes (Signed)
Seen, agree with above.  Hopefully home tomorrow. Dr. Elnoria HowardHung placed stent, pt will need repeat ERCP in 1 month with general anesthesia

## 2013-09-03 NOTE — Op Note (Signed)
[facility] [address] [place], [id number]   ERCP PROCEDURE REPORT  PATIENT: Ernest Horton, Ernest Horton  MR# :[id number] BIRTHDATE: Dec 17, 1945  GENDER: Male ENDOSCOPIST: Jeani HawkingPatrick Fallon Haecker, MD REFERRED BY: CCS PROCEDURE DATE:  09/03/2013 PROCEDURE:   ERCP with stent insertion ASA CLASS:   Class III INDICATIONS: Choledocholithiasis MEDICATIONS: Versed 5 mg IV and Fentanyl 75 mcg IV TOPICAL ANESTHETIC: Cetacaine Spray  DESCRIPTION OF PROCEDURE:   After the risks benefits and alternatives of the procedure were thoroughly explained, informed consent was obtained.  The     endoscope was introduced through the mouth  and advanced to the second portion of the duodenum. Manuvering through the gastric lumen was difficult.  He may have a large hiatal hernia, but it was difficult to discern.  Holding a stable postion to view the ampulla was difficult in both the long and short positions.  Additionally, maintaining oxygen saturation and the patient struggling with the procedure was difficult to manage.  The CBD was able to be cannulated during the first attempt and the guidewire was secured in the right intrahepatic ducts. Contrast injection revealed a mildly dilated CBD up to 1 cm and multiple filling defects.  No bile duct leak was identified.  The patient continued to struggle and moved, which precluded a stable postion to perform a sphincterotomy.  I made the decision to insert an 8.5 Fr x 5 cm biliary stent.  Excellent drainage was achived with the stent.    The scope was then completely withdrawn from the patient and the procedure terminated.  Fluroscopy confirmed the position of the stent.     COMPLICATIONS:  ENDOSCOPIC IMPRESSION: 1) Choledocholithiasis.  RECOMMENDATIONS: 1) Repeat ERCP in one month with anesthesia.    _______________________________ eSigned:  Jeani HawkingPatrick Cattie Tineo, MD 09/03/2013 4:00 PM   CC:

## 2013-09-04 ENCOUNTER — Encounter (HOSPITAL_COMMUNITY): Payer: Self-pay | Admitting: Gastroenterology

## 2013-09-04 ENCOUNTER — Inpatient Hospital Stay (HOSPITAL_COMMUNITY): Payer: Medicare HMO

## 2013-09-04 DIAGNOSIS — R0902 Hypoxemia: Secondary | ICD-10-CM

## 2013-09-04 LAB — COMPREHENSIVE METABOLIC PANEL
ALBUMIN: 2.5 g/dL — AB (ref 3.5–5.2)
ALT: 50 U/L (ref 0–53)
AST: 29 U/L (ref 0–37)
Alkaline Phosphatase: 79 U/L (ref 39–117)
BILIRUBIN TOTAL: 0.2 mg/dL — AB (ref 0.3–1.2)
BUN: 8 mg/dL (ref 6–23)
CO2: 29 mEq/L (ref 19–32)
CREATININE: 1.05 mg/dL (ref 0.50–1.35)
Calcium: 8.4 mg/dL (ref 8.4–10.5)
Chloride: 101 mEq/L (ref 96–112)
GFR calc non Af Amer: 72 mL/min — ABNORMAL LOW (ref >90)
GFR, EST AFRICAN AMERICAN: 83 mL/min — AB (ref >90)
GLUCOSE: 118 mg/dL — AB (ref 70–99)
Potassium: 4.4 mEq/L (ref 3.7–5.3)
Sodium: 137 mEq/L (ref 137–147)
Total Protein: 5.7 g/dL — ABNORMAL LOW (ref 6.0–8.3)

## 2013-09-04 LAB — CBC
HEMATOCRIT: 34.2 % — AB (ref 39.0–52.0)
HEMOGLOBIN: 11.7 g/dL — AB (ref 13.0–17.0)
MCH: 31.5 pg (ref 26.0–34.0)
MCHC: 34.2 g/dL (ref 30.0–36.0)
MCV: 91.9 fL (ref 78.0–100.0)
Platelets: 330 10*3/uL (ref 150–400)
RBC: 3.72 MIL/uL — ABNORMAL LOW (ref 4.22–5.81)
RDW: 13.4 % (ref 11.5–15.5)
WBC: 11.2 10*3/uL — ABNORMAL HIGH (ref 4.0–10.5)

## 2013-09-04 MED ORDER — SODIUM CHLORIDE 0.9 % IJ SOLN
3.0000 mL | Freq: Two times a day (BID) | INTRAMUSCULAR | Status: DC
  Administered 2013-09-04 (×2): 3 mL via INTRAVENOUS

## 2013-09-04 MED ORDER — CIPROFLOXACIN HCL 500 MG PO TABS
500.0000 mg | ORAL_TABLET | Freq: Two times a day (BID) | ORAL | Status: DC
  Administered 2013-09-04 – 2013-09-05 (×2): 500 mg via ORAL
  Filled 2013-09-04 (×4): qty 1

## 2013-09-04 MED ORDER — SODIUM CHLORIDE 0.9 % IJ SOLN: 3.0000 mL | INTRAMUSCULAR | Status: DC | PRN

## 2013-09-04 NOTE — Progress Notes (Signed)
Seen, agree.    Has ileus, home when resolves.

## 2013-09-04 NOTE — Progress Notes (Signed)
Patient ID: Ernest Horton, male   DOB: Aug 25, 1945, 68 y.o.   MRN: [id number]  Subjective: Wants to go home.  Denies sob, cp, palpitations.  Tolerating diet, no flatus.  Ambulating, using IS.    Objective:  Vital signs:  Filed Vitals:   09/03/13 1700 09/03/13 1800 09/03/13 2217 09/04/13 0439  BP: 128/80 128/73 117/75 106/69  Pulse: 103 103 99 91  Temp: 97.6 F (36.4 C) 98.6 F (37 C) 99.1 F (37.3 C) 98.6 F (37 C)  TempSrc: Oral Oral Oral Oral  Resp: $Remo'20 20 16 16  'zsbuO$ Height:      Weight:      SpO2: 93% 90% 88% 88%       Intake/Output   Yesterday:  04/23 0701 - 04/24 0700 In: 3825 [P.O.:240; I.V.:2600; IV Piggyback:200] Out: 2310 [Urine:2250; Drains:60] This shift:  Total I/O In: 240 [P.O.:240] Out: 415 [Urine:400; Drains:15]   Physical Exam: General: Pt awake/alert/oriented x4 in no acute distress Chest: cta.  No chest wall pain w good excursion CV:  Pulses intact.  Regular rhythm MS: Normal AROM mjr joints.  No obvious deformity Abdomen: Soft.  distended.  Appropriately tender.  Incisions are c/d/i.  RUQ drain with serosanguinous output..  No evidence of peritonitis.  No incarcerated hernias.   Problem List:   Principal Problem:   Acute cholecystitis with chronic cholecystitis Active Problems:   Tobacco abuse   Anxiety   Renal calculus, left   Diverticulosis of sigmoid colon   SOB (shortness of breath)   HOH (hard of hearing)    Results:   Labs: Results for orders placed during the hospital encounter of 08/31/13 (from the past 48 hour(s))  COMPREHENSIVE METABOLIC PANEL     Status: Abnormal   Collection Time    09/03/13  4:25 AM      Result Value Ref Range   Sodium 138  137 - 147 mEq/L   Potassium 4.2  3.7 - 5.3 mEq/L   Chloride 101  96 - 112 mEq/L   CO2 27  19 - 32 mEq/L   Glucose, Bld 115 (*) 70 - 99 mg/dL   BUN 11  6 - 23 mg/dL   Creatinine, Ser 1.00  0.50 - 1.35 mg/dL   Calcium 8.5  8.4 - 10.5 mg/dL   Total Protein 6.0  6.0 - 8.3 g/dL   Albumin 2.6 (*) 3.5 - 5.2 g/dL   AST 44 (*) 0 - 37 U/L   ALT 72 (*) 0 - 53 U/L   Alkaline Phosphatase 94  39 - 117 U/L   Total Bilirubin <0.2 (*) 0.3 - 1.2 mg/dL   GFR calc non Af Amer 76 (*) >90 mL/min   GFR calc Af Amer 89 (*) >90 mL/min   Comment: (NOTE)     The eGFR has been calculated using the CKD EPI equation.     This calculation has not been validated in all clinical situations.     eGFR's persistently <90 mL/min signify possible Chronic Kidney     Disease.  CBC     Status: Abnormal   Collection Time    09/04/13  4:05 AM      Result Value Ref Range   WBC 11.2 (*) 4.0 - 10.5 K/uL   RBC 3.72 (*) 4.22 - 5.81 MIL/uL   Hemoglobin 11.7 (*) 13.0 - 17.0 g/dL   HCT 34.2 (*) 39.0 - 52.0 %   MCV 91.9  78.0 - 100.0 fL   MCH 31.5  26.0 - 34.0 pg  MCHC 34.2  30.0 - 36.0 g/dL   RDW 53.6  64.0 - 17.7 %   Platelets 330  150 - 400 K/uL  COMPREHENSIVE METABOLIC PANEL     Status: Abnormal   Collection Time    09/04/13  4:05 AM      Result Value Ref Range   Sodium 137  137 - 147 mEq/L   Potassium 4.4  3.7 - 5.3 mEq/L   Chloride 101  96 - 112 mEq/L   CO2 29  19 - 32 mEq/L   Glucose, Bld 118 (*) 70 - 99 mg/dL   BUN 8  6 - 23 mg/dL   Creatinine, Ser 6.52  0.50 - 1.35 mg/dL   Calcium 8.4  8.4 - 85.9 mg/dL   Total Protein 5.7 (*) 6.0 - 8.3 g/dL   Albumin 2.5 (*) 3.5 - 5.2 g/dL   AST 29  0 - 37 U/L   ALT 50  0 - 53 U/L   Alkaline Phosphatase 79  39 - 117 U/L   Total Bilirubin 0.2 (*) 0.3 - 1.2 mg/dL   GFR calc non Af Amer 72 (*) >90 mL/min   GFR calc Af Amer 83 (*) >90 mL/min   Comment: (NOTE)     The eGFR has been calculated using the CKD EPI equation.     This calculation has not been validated in all clinical situations.     eGFR's persistently <90 mL/min signify possible Chronic Kidney     Disease.    Imaging / Studies: Dg Cholangiogram Operative  09/02/2013   CLINICAL DATA:  Intraoperative cholangiogram.  EXAM: INTRAOPERATIVE CHOLANGIOGRAM  TECHNIQUE: Cholangiographic images  from the C-arm fluoroscopic device were submitted for interpretation post-operatively. Please see the procedural report for the amount of contrast and the fluoroscopy time utilized.  COMPARISON:  CT abdomen pelvis 08/31/2013.  FINDINGS: Interoperative cholangiogram shows opacification of the cystic duct and biliary tree with lucent air bubbles seen traveling non dependently with additional filling defects seen dependently in the lower common bile duct. Common bile duct may be mildly dilated. Contrast flows readily into the duodenum. Contrast is also seen within the colon.  IMPRESSION: Stones within the lower common bile duct. Common bile duct appears mildly dilated. Findings were discussed with Dr. Donell Beers prior to dictation.   Electronically Signed   By: Leanna Battles M.D.   On: 09/02/2013 13:25   Dg Ercp Biliary & Pancreatic Ducts  09/03/2013   CLINICAL DATA:  Choledocholithiasis.  EXAM: ERCP with sphincterotomy  TECHNIQUE: Multiple spot images obtained with the fluoroscopic device and submitted for interpretation post-procedure.  COMPARISON:  DG CHOLANGIOGRAM OPERATIVE dated 09/02/2013; CT ABD/PELVIS W CM dated 08/31/2013  FINDINGS: Four spot fluoroscopic images are provided. Initial image demonstrates the endoscope within the duodenum with cannulization of the ampulla and a guidewire in the common bile duct. Passed stone extraction was performed. A biliary stent was placed. The duct drains following stent placement.  IMPRESSION: ERCP with sphincterotomy, stone extraction, and stent placement.  These images were submitted for radiologic interpretation only. Please see the procedural report for the amount of contrast and the fluoroscopy time utilized.   Electronically Signed   By: Genevive Bi M.D.   On: 09/03/2013 18:18    Medications / Allergies: per chart  Antibiotics: Anti-infectives   Start     Dose/Rate Route Frequency Ordered Stop   08/31/13 2200  Ampicillin-Sulbactam (UNASYN) 3 g in sodium  chloride 0.9 % 100 mL IVPB     3 g  100 mL/hr over 60 Minutes Intravenous Every 6 hours 08/31/13 2017         Assessment/Plan  1. Acute cholecystitis; S/p Laparoscopic Cholecystectomy with IOC , 09/02/13, Dr. Barry Dienes  IOC shows stones in lower CBD  2. Hx of tobacco use  3. Hypoxia POD#2 -DC drain -DC foley -c/w regular diet -obtain a CXR, ambulate the patient and check sats -unasyn 08/31/13---> -IS(1229ml) -mobilize  -SLIV -SCDs/heparin   Erby Pian, [facility] Surgery Pager [phone] Office [phone]  09/04/2013  11:04 AM

## 2013-09-04 NOTE — Progress Notes (Signed)
Subjective: No acute events.  He feels well and no complications from the procedure.  Objective: Vital signs in last 24 hours: Temp:  [97.6 F (36.4 C)-99.1 F (37.3 C)] 98.6 F (37 C) (04/24 0439) Pulse Rate:  [91-118] 91 (04/24 0439) Resp:  [13-31] 16 (04/24 0439) BP: (106-178)/(69-109) 106/69 mmHg (04/24 0439) SpO2:  [81 %-95 %] 88 % (04/24 0439)    Intake/Output from previous day: 04/23 0701 - 04/24 0700 In: 3040 [P.O.:240; I.V.:2600; IV Piggyback:200] Out: 2310 [Urine:2250; Drains:60] Intake/Output this shift: Total I/O In: 240 [P.O.:240] Out: 415 [Urine:400; Drains:15]  General appearance: alert and no distress GI: soft, non-tender; bowel sounds normal; no masses,  no organomegaly  Lab Results:  Recent Labs  09/04/13 0405  WBC 11.2*  HGB 11.7*  HCT 34.2*  PLT 330   BMET  Recent Labs  09/03/13 0425 09/04/13 0405  NA 138 137  K 4.2 4.4  CL 101 101  CO2 27 29  GLUCOSE 115* 118*  BUN 11 8  CREATININE 1.00 1.05  CALCIUM 8.5 8.4   LFT  Recent Labs  09/04/13 0405  PROT 5.7*  ALBUMIN 2.5*  AST 29  ALT 50  ALKPHOS 79  BILITOT 0.2*   PT/INR No results found for this basename: LABPROT, INR,  in the last 72 hours Hepatitis Panel No results found for this basename: HEPBSAG, HCVAB, HEPAIGM, HEPBIGM,  in the last 72 hours C-Diff No results found for this basename: CDIFFTOX,  in the last 72 hours Fecal Lactopherrin No results found for this basename: FECLLACTOFRN,  in the last 72 hours  Studies/Results: Dg Cholangiogram Operative  09/02/2013   CLINICAL DATA:  Intraoperative cholangiogram.  EXAM: INTRAOPERATIVE CHOLANGIOGRAM  TECHNIQUE: Cholangiographic images from the C-arm fluoroscopic device were submitted for interpretation post-operatively. Please see the procedural report for the amount of contrast and the fluoroscopy time utilized.  COMPARISON:  CT abdomen pelvis 08/31/2013.  FINDINGS: Interoperative cholangiogram shows opacification of the  cystic duct and biliary tree with lucent air bubbles seen traveling non dependently with additional filling defects seen dependently in the lower common bile duct. Common bile duct may be mildly dilated. Contrast flows readily into the duodenum. Contrast is also seen within the colon.  IMPRESSION: Stones within the lower common bile duct. Common bile duct appears mildly dilated. Findings were discussed with Dr. Donell BeersByerly prior to dictation.   Electronically Signed   By: Leanna BattlesMelinda  Blietz M.D.   On: 09/02/2013 13:25   Dg Ercp Biliary & Pancreatic Ducts  09/03/2013   CLINICAL DATA:  Choledocholithiasis.  EXAM: ERCP with sphincterotomy  TECHNIQUE: Multiple spot images obtained with the fluoroscopic device and submitted for interpretation post-procedure.  COMPARISON:  DG CHOLANGIOGRAM OPERATIVE dated 09/02/2013; CT ABD/PELVIS W CM dated 08/31/2013  FINDINGS: Four spot fluoroscopic images are provided. Initial image demonstrates the endoscope within the duodenum with cannulization of the ampulla and a guidewire in the common bile duct. Passed stone extraction was performed. A biliary stent was placed. The duct drains following stent placement.  IMPRESSION: ERCP with sphincterotomy, stone extraction, and stent placement.  These images were submitted for radiologic interpretation only. Please see the procedural report for the amount of contrast and the fluoroscopy time utilized.   Electronically Signed   By: Genevive BiStewart  Edmunds M.D.   On: 09/03/2013 18:18    Medications:  Scheduled: . ampicillin-sulbactam (UNASYN) IV  3 g Intravenous Q6H  . citalopram  20 mg Oral Daily  . docusate sodium  100 mg Oral QHS  . heparin  5,000 Units Subcutaneous 3 times per day  . lactated ringers  1,000 mL Intravenous Once  . lip balm  1 application Topical BID  . loratadine  10 mg Oral Daily  . psyllium  1 packet Oral BID  . saccharomyces boulardii  250 mg Oral BID  . traZODone  200 mg Oral QHS  . venlafaxine XR  225 mg Oral Q  breakfast  . vitamin E  400 Units Oral Daily   Continuous: . dextrose 5 % and 0.45 % NaCl with KCl 40 mEq/L 100 mL/hr at 09/04/13 0735    Assessment/Plan: 1) Choledocholithiasis. 2) S/p acute cholecystitis.   The patient is well today.  I explained to him why the procedure was not able to be completed, i.e., sedation and oxygenation issues.  He understands and he is fine with the repeat ERCP in one month as an outpatient with anesthesia.  Plan: 1) Will make arrangements for the patient to follow up in one month.   LOS: 4 days   Ernest Horton 09/04/2013, 10:04 AM

## 2013-09-05 DIAGNOSIS — K8050 Calculus of bile duct without cholangitis or cholecystitis without obstruction: Secondary | ICD-10-CM

## 2013-09-05 MED ORDER — NAPROXEN 500 MG PO TABS: 500.0000 mg | ORAL_TABLET | Freq: Two times a day (BID) | ORAL | Status: DC

## 2013-09-05 MED ORDER — AMOXICILLIN-POT CLAVULANATE 875-125 MG PO TABS: 1.0000 | ORAL_TABLET | Freq: Two times a day (BID) | ORAL | Status: DC

## 2013-09-05 MED ORDER — HYDROCODONE-ACETAMINOPHEN 5-325 MG PO TABS: 1.0000 | ORAL_TABLET | ORAL | Status: DC | PRN

## 2013-09-05 NOTE — Discharge Instructions (Signed)
LAPAROSCOPIC SURGERY: POST OP INSTRUCTIONS ° °1. DIET: Follow a light bland diet the first 24 hours after arrival home, such as soup, liquids, crackers, etc.  Be sure to include lots of fluids daily.  Avoid fast food or heavy meals as your are more likely to get nauseated.  Eat a low fat the next few days after surgery.   °2. Take your usually prescribed home medications unless otherwise directed. °3. PAIN CONTROL: °a. Pain is best controlled by a usual combination of three different methods TOGETHER: °i. Ice/Heat °ii. Over the counter pain medication °iii. Prescription pain medication °b. Most patients will experience some swelling and bruising around the incisions.  Ice packs or heating pads (30-60 minutes up to 6 times a day) will help. Use ice for the first few days to help decrease swelling and bruising, then switch to heat to help relax tight/sore spots and speed recovery.  Some people prefer to use ice alone, heat alone, alternating between ice & heat.  Experiment to what works for you.  Swelling and bruising can take several weeks to resolve.   °c. It is helpful to take an over-the-counter pain medication regularly for the first few weeks.  Choose one of the following that works best for you: °i. Naproxen (Aleve, etc)  Two 220mg tabs twice a day °ii. Ibuprofen (Advil, etc) Three 200mg tabs four times a day (every meal & bedtime) °iii. Acetaminophen (Tylenol, etc) 500-650mg four times a day (every meal & bedtime) °d. A  prescription for pain medication (such as oxycodone, hydrocodone, etc) should be given to you upon discharge.  Take your pain medication as prescribed.  °i. If you are having problems/concerns with the prescription medicine (does not control pain, nausea, vomiting, rash, itching, etc), please call us [phone] to see if we need to switch you to a different pain medicine that will work better for you and/or control your side effect better. °ii. If you need a refill on your pain medication,  please contact your pharmacy.  They will contact our office to request authorization. Prescriptions will not be filled after 5 pm or on week-ends. °4. Avoid getting constipated.  Between the surgery and the pain medications, it is common to experience some constipation.  Increasing fluid intake and taking a fiber supplement (such as Metamucil, Citrucel, FiberCon, MiraLax, etc) 1-2 times a day regularly will usually help prevent this problem from occurring.  A mild laxative (prune juice, Milk of Magnesia, MiraLax, etc) should be taken according to package directions if there are no bowel movements after 48 hours.   °5. Watch out for diarrhea.  If you have many loose bowel movements, simplify your diet to bland foods & liquids for a few days.  Stop any stool softeners and decrease your fiber supplement.  Switching to mild anti-diarrheal medications (Kayopectate, Pepto Bismol) can help.  If this worsens or does not improve, please call us. °6. Wash / shower every day.  You may shower over the dressings as they are waterproof.  Continue to shower over incision(s) after the dressing is off. °7. Remove your waterproof bandages 5 days after surgery.  You may leave the incision open to air.  You may replace a dressing/Band-Aid to cover the incision for comfort if you wish.  °8. ACTIVITIES as tolerated:   °a. You may resume regular (light) daily activities beginning the next day--such as daily self-care, walking, climbing stairs--gradually increasing activities as tolerated.  If you can walk 30 minutes without difficulty, it   is safe to try more intense activity such as jogging, treadmill, bicycling, low-impact aerobics, swimming, etc. b. Save the most intensive and strenuous activity for last such as sit-ups, heavy lifting, contact sports, etc  Refrain from any heavy lifting or straining until you are off narcotics for pain control.   c. DO NOT PUSH THROUGH PAIN.  Let pain be your guide: If it hurts to do something, don't  do it.  Pain is your body warning you to avoid that activity for another week until the pain goes down. d. You may drive when you are no longer taking prescription pain medication, you can comfortably wear a seatbelt, and you can safely maneuver your car and apply brakes. e. Ernest Horton may have sexual intercourse when it is comfortable.  9. FOLLOW UP in our office a. Please call [facility] at [phone] to set up an appointment to see your surgeon in the office for a follow-up appointment approximately 2-3 weeks after your surgery. b. Make sure that you call for this appointment the day you arrive home to insure a convenient appointment time. 10. IF YOU HAVE DISABILITY OR FAMILY LEAVE FORMS, BRING THEM TO THE OFFICE FOR PROCESSING.  DO NOT GIVE THEM TO YOUR DOCTOR.   WHEN TO CALL us [phone]: 1. Poor pain control 2. Reactions / problems with new medications (rash/itching, nausea, etc)  3. Fever over 101.5 F (38.5 C) 4. Inability to urinate 5. Nausea and/or vomiting 6. Worsening swelling or bruising 7. Continued bleeding from incision. 8. Increased pain, redness, or drainage from the incision   The clinic staff is available to answer your questions during regular business hours (8:30am-5pm).  Please dont hesitate to call and ask to speak to one of our nurses for clinical concerns.   If you have a medical emergency, go to the nearest emergency room or call 911.  A surgeon from [facility] is always on call at the [facility], [address], [place], [place], [place]  [address] ? MAIN: [phone] ? TOLL FREE: [phone] ?  FAX [phone] [email or web address]  Common Bile Duct Stones The gallbladder is located in the right side of the upper belly (abdomen) under the liver. It stores bile from the liver until it is needed. Bile is made up of cholesterol, water, salts, fats, proteins, and waste product (bilirubin). The common  bile duct is the tube that carries bile from other ducts to the small intestine to help digest fats. Bile duct stones are stone-like pieces of material that form in the gallbladder and become lodged in the common bile duct. Stones may be present in the gallbladder as well as the bile duct. They can vary in size and number. Having a stone in the common bile duct is a serious problem. The stone may pass on its own. However, if it gets stuck and causes a blockage in the common bile duct, yellow color in the skin or eyes (jaundice) develops. If the blockage persists, the bile becomes infected (cholangitis). This is a life-threatening problem. CAUSES  Excess cholesterol in the bile is the most common reason bile hardens into a stone-like material. Excess bilirubin or not enough bile salts may also cause the bile to harden. Why this happens is not entirely clear. Certain factors increase the risk of developing stones, such as:  Being male. Women are twice as likely to develop gallstones, especially those who are pregnant, taking hormone replacement therapy, or taking birth  control pills.  Having a family history of stones.  Being overweight (even moderately overweight).  Consuming a high-fat, high-cholesterol diet.  Crash dieting or rapid weight loss.  Being older than 60.  Having American [place] or [place] heritage.  Taking cholesterol-lowering medicine.  Having diabetes. SYMPTOMS  Small bile duct stones may not cause any symptoms. They are sometimes discovered while having tests for other conditions. Larger stones can block the flow of bile into the small intestine. This can cause infection and inflammation in the gallbladder, ducts, or sometimes, the liver or pancreas. Symptoms (sometimes called a "gallbladder attack") may include:  Pain in the upper right abdomen (biliary colic). The pain can last from 30 minutes to several hours.  Pain between the shoulder blades at the  back.  Pain under the right shoulder.  Nausea.  Vomiting.  Fever.  Jaundice. DIAGNOSIS  Symptoms can mimic other conditions, so proper diagnosis is important. An imaging test is often performed to confirm the diagnosis and determine the location and size of the stones, such as:  Ultrasound.  CT scan.  Cholescintigraphy, or HIDA (hepatobiliary iminodiacetic acid) scan. This is an imaging technique using a safe, radioactive dye.  ERCP (endoscopic retrograde cholangiopancreatography). This is an imaging technique that can detect and remove stones. Blood tests may also be performed. TREATMENT  Watchful waiting may be all that is necessary if you do not have symptoms. If you are experiencing pain or other symptoms:  Pain medicine and antibiotics may be given.  ERCP may be performed to locate and remove the stones to relieve the obstruction. In this procedure, a tube is threaded through the mouth, down into the small intestine. After having an ERCP, the gallbladder will often be removed (cholecystectomy) during the same hospitalization. Cholecystectomy is typically performed with a flexible telescope-like instrument (laparoscope) through tiny incisions.  Rarely, some cases may require open surgery, with a larger incision. Open surgery is needed when ERCP is unsuccessful to remove the stone or if laparoscopic surgery is unsuccessful to remove the gallbladder.  Insertion of a bile drainage tube (cholecystostomy) may be performed to take care of common bile duct stones. In this procedure, a tube is inserted into the gallbladder or the bile ducts in the liver to relieve the pressure caused by the stone. After the patient gets better, the gallbladder will be removed and the stone dealt with. PREVENTION   Limit your intake of fats and cholesterol.  Avoid "crash" diets or rapid weight loss.  Maintain a healthy weight. HOME CARE INSTRUCTIONS   Take pain relievers as directed.  Limit your  intake of fats and cholesterol if you experience pain or bloating. Try eating smaller meals.  Follow up with your caregiver for additional exams and testing as directed.  If you have had a procedure or surgery, follow your caregiver's specific instructions for care after surgery. SEEK MEDICAL CARE IF:  Attacks become more frequent and impact your daily activities. SEEK IMMEDIATE MEDICAL CARE IF:   Your pain does not go away or becomes severe (lasts up to 5 hours).  You have a fever.  You feel nauseous.  You are vomiting.  You appear to have jaundice.  Your stools are light reddish-brown in color. MAKE SURE YOU:   Understand these instructions.  Will watch your condition.  Will get help right away if you are not doing well or get worse. Document Released: 07/25/2009 Document Revised: 07/23/2011 Document Reviewed: 07/25/2009 [facility] Patient Information 2014 [place], [place].   Cholecystitis  Cholecystitis is an inflammation of your gallbladder. It is usually caused by a buildup of gallstones or sludge (cholelithiasis) in your gallbladder. The gallbladder stores a fluid that helps digest fats (bile). Cholecystitis is serious and needs treatment right away.  CAUSES   Gallstones. Gallstones can block the tube that leads to your gallbladder, causing bile to build up. As bile builds up, the gallbladder becomes inflamed.  Bile duct problems, such as blockage from scarring or kinking.  Tumors. Tumors can stop bile from leaving your gallbladder correctly, causing bile to build up. As bile builds up, the gallbladder becomes inflamed. SYMPTOMS   Nausea.  Vomiting.  Abdominal pain, especially in the upper right area of your abdomen.  Abdominal tenderness or bloating.  Sweating.  Chills.  Fever.  Yellowing of the skin and the whites of the eyes (jaundice). DIAGNOSIS  Your caregiver may order blood tests to look for infection or gallbladder problems. Your caregiver may also  order imaging tests, such as an ultrasound or computed tomography (CT) scan. Further tests may include a hepatobiliary iminodiacetic acid (HIDA) scan. This scan allows your caregiver to see your bile move from the liver to the gallbladder and to the small intestine. TREATMENT  A hospital stay is usually necessary to lessen the inflammation of your gallbladder. You may be required to not eat or drink (fast) for a certain amount of time. You may be given medicine to treat pain or an antibiotic medicine to treat an infection. Surgery may be needed to remove your gallbladder (cholecystectomy) once the inflammation has gone down. Surgery may be needed right away if you develop complications such as death of gallbladder tissue (gangrene) or a tear (perforation) of the gallbladder.  HOME CARE INSTRUCTIONS  Home care will depend on your treatment. In general:  If you were given antibiotics, take them as directed. Finish them even if you start to feel better.  Only take over-the-counter or prescription medicines for pain, discomfort, or fever as directed by your caregiver.  Follow a low-fat diet until you see your caregiver again.  Keep all follow-up visits as directed by your caregiver. SEEK IMMEDIATE MEDICAL CARE IF:   Your pain is increasing and not controlled by medicines.  Your pain moves to another part of your abdomen or to your back.  You have a fever.  You have nausea and vomiting. MAKE SURE YOU:  Understand these instructions.  Will watch your condition.  Will get help right away if you are not doing well or get worse. Document Released: 04/30/2005 Document Revised: 07/23/2011 Document Reviewed: 03/16/2011 [facility] Patient Information 2014 [place] King[employer], [place].  GETTING TO GOOD BOWEL HEALTH. Irregular bowel habits such as constipation and diarrhea can lead to many problems over time.  Having one soft bowel movement a day is the most important way to prevent further problems.  The  anorectal canal is designed to handle stretching and feces to safely manage our ability to get rid of solid waste (feces, poop, stool) out of our body.  BUT, hard constipated stools can act like ripping concrete bricks and diarrhea can be a burning fire to this very sensitive area of our body, causing inflamed hemorrhoids, anal fissures, increasing risk is perirectal abscesses, abdominal pain/bloating, an making irritable bowel worse.     The goal: ONE SOFT BOWEL MOVEMENT A DAY!  To have soft, regular bowel movements:    Drink at least 8 tall glasses of water a day.     Take plenty of fiber.  Fiber  is the undigested part of plant food that passes into the colon, acting s natures broom to encourage bowel motility and movement.  Fiber can absorb and hold large amounts of water. This results in a larger, bulkier stool, which is soft and easier to pass. Work gradually over several weeks up to 6 servings a day of fiber (25g a day even more if needed) in the form of: o Vegetables -- Root (potatoes, carrots, turnips), leafy green (lettuce, salad greens, celery, spinach), or cooked high residue (cabbage, broccoli, etc) o Fruit -- Fresh (unpeeled skin & pulp), Dried (prunes, apricots, cherries, etc ),  or stewed ( applesauce)  o Whole grain breads, pasta, etc (whole wheat)  o Bran cereals    Bulking Agents -- This type of water-retaining fiber generally is easily obtained each day by one of the following:  o Psyllium bran -- The psyllium plant is remarkable because its ground seeds can retain so much water. This product is available as Metamucil, Konsyl, Effersyllium, Per Diem Fiber, or the less expensive generic preparation in drug and health food stores. Although labeled a laxative, it really is not a laxative.  o Methylcellulose -- This is another fiber derived from wood which also retains water. It is available as Citrucel. o Polyethylene Glycol - and artificial fiber commonly called Miralax or Glycolax.   It is helpful for people with gassy or bloated feelings with regular fiber o Flax Seed - a less gassy fiber than psyllium   No reading or other relaxing activity while on the toilet. If bowel movements take longer than 5 minutes, you are too constipated   AVOID CONSTIPATION.  High fiber and water intake usually takes care of this.  Sometimes a laxative is needed to stimulate more frequent bowel movements, but    Laxatives are not a good long-term solution as it can wear the colon out. o Osmotics (Milk of Magnesia, Fleets phosphosoda, Magnesium citrate, MiraLax, GoLytely) are safer than  o Stimulants (Senokot, Castor Oil, Dulcolax, Ex Lax)    o Do not take laxatives for more than 7days in a row.    IF SEVERELY CONSTIPATED, try a Bowel Retraining Program: o Do not use laxatives.  o Eat a diet high in roughage, such as bran cereals and leafy vegetables.  o Drink six (6) ounces of prune or apricot juice each morning.  o Eat two (2) large servings of stewed fruit each day.  o Take one (1) heaping tablespoon of a psyllium-based bulking agent twice a day. Use sugar-free sweetener when possible to avoid excessive calories.  o Eat a normal breakfast.  o Set aside 15 minutes after breakfast to sit on the toilet, but do not strain to have a bowel movement.  o If you do not have a bowel movement by the third day, use an enema and repeat the above steps.    Controlling diarrhea o Switch to liquids and simpler foods for a few days to avoid stressing your intestines further. o Avoid dairy products (especially milk & ice cream) for a short time.  The intestines often can lose the ability to digest lactose when stressed. o Avoid foods that cause gassiness or bloating.  Typical foods include beans and other legumes, cabbage, broccoli, and dairy foods.  Every person has some sensitivity to other foods, so listen to our body and avoid those foods that trigger problems for you. o Adding fiber (Citrucel, Metamucil,  psyllium, Miralax) gradually can help thicken stools by absorbing excess fluid  and retrain the intestines to act more normally.  Slowly increase the dose over a few weeks.  Too much fiber too soon can backfire and cause cramping & bloating. o Probiotics (such as active yogurt, Align, etc) may help repopulate the intestines and colon with normal bacteria and calm down a sensitive digestive tract.  Most studies show it to be of mild help, though, and such products can be costly. o Medicines:   Bismuth subsalicylate (ex. Kayopectate, Pepto Bismol) every 30 minutes for up to 6 doses can help control diarrhea.  Avoid if pregnant.   Loperamide (Immodium) can slow down diarrhea.  Start with two tablets (4mg  total) first and then try one tablet every 6 hours.  Avoid if you are having fevers or severe pain.  If you are not better or start feeling worse, stop all medicines and call your doctor for advice o Call your doctor if you are getting worse or not better.  Sometimes further testing (cultures, endoscopy, X-ray studies, bloodwork, etc) may be needed to help diagnose and treat the cause of the diarrhea.  Managing Pain  Pain after surgery or related to activity is often due to strain/injury to muscle, tendon, nerves and/or incisions.  This pain is usually short-term and will improve in a few months.   Many people find it helpful to do the following things TOGETHER to help speed the process of healing and to get back to regular activity more quickly:  1. Avoid heavy physical activity a.  no lifting greater than 20 pounds b. Do not push through the pain.  Listen to your body and avoid positions and maneuvers than reproduce the pain c. Walking is okay as tolerated, but go slowly and stop when getting sore.  d. Remember: If it hurts to do it, then dont do it! 2. Take Anti-inflammatory medication  a. Take with food/snack around the clock for 1-2 weeks i. This helps the muscle and nerve tissues become less  irritable and calm down faster b. Choose ONE of the following over-the-counter medications: i. Naproxen 220mg  tabs (ex. Aleve) 1-2 pills twice a day  ii. Ibuprofen 200mg  tabs (ex. Advil, Motrin) 3-4 pills with every meal and just before bedtime iii. Acetaminophen 500mg  tabs (Tylenol) 1-2 pills with every meal and just before bedtime 3. Use a Heating pad or Ice/Cold Pack a. 4-6 times a day b. May use warm bath/hottub  or showers 4. Try Gentle Massage and/or Stretching  a. at the area of pain many times a day b. stop if you feel pain - do not overdo it  Try these steps together to help you body heal faster and avoid making things get worse.  Doing just one of these things may not be enough.    If you are not getting better after two weeks or are noticing you are getting worse, contact our office for further advice; we may need to re-evaluate you & see what other things we can do to help.

## 2013-09-05 NOTE — Discharge Summary (Signed)
Physician Discharge Summary  Patient ID: Ernest Horton MRN: [id number] DOB/AGE: Oct 21, 1945 68 y.o.  Admit date: 08/31/2013 Discharge date: 09/05/2013  Admission Diagnoses:  Discharge Diagnoses:  Principal Problem:   Acute cholecystitis with chronic cholecystitis Active Problems:   Tobacco abuse   Anxiety   Renal calculus, left   Diverticulosis of sigmoid colon   SOB (shortness of breath)   HOH (hard of hearing)   Choledocholithiasis s/p ERCP/stent 09/03/2013   Discharged Condition: good  Hospital Course:   Pleasant patient with severe cholecystitis.  Admitted.  Hydrated.  Placed on IV antibiotics.  Underwent laparoscopic cholecystectomy.  Required drain.  Common bile duct stone noted.  ERCP done the next day.  Stone not able to be successfully removed.  Biliary stent placed.  Postoperatively, the patient mobilized in the hallways and advanced to a solid diet gradually.  He did struggle with some bloating with food especially with psyllium fiber.  He had flatus on the day of discharge and felt much better.  Pain was well-controlled and transitioned off IV medications.    By the time of discharge, the patient was walking well the hallways, eating food well, having flatus.  Pain was-controlled on an oral regimen.  Based on meeting DC criteria and recovering well, I felt it was safe for the patient to be discharged home with close followup.  Instructions were discussed in detail.  They are written as well.  Given the significant cholecystitis and biliary stent in place, planned oral antibiotics for 5 more days.  Augmentin.  Consults: GI  Significant Diagnostic Studies:   Results for orders placed during the hospital encounter of 08/31/13 (from the past 72 hour(s))  COMPREHENSIVE METABOLIC PANEL     Status: Abnormal   Collection Time    09/03/13  4:25 AM      Result Value Ref Range   Sodium 138  137 - 147 mEq/L   Potassium 4.2  3.7 - 5.3 mEq/L   Chloride 101  96 - 112 mEq/L   CO2  27  19 - 32 mEq/L   Glucose, Bld 115 (*) 70 - 99 mg/dL   BUN 11  6 - 23 mg/dL   Creatinine, Ser 1.00  0.50 - 1.35 mg/dL   Calcium 8.5  8.4 - 10.5 mg/dL   Total Protein 6.0  6.0 - 8.3 g/dL   Albumin 2.6 (*) 3.5 - 5.2 g/dL   AST 44 (*) 0 - 37 U/L   ALT 72 (*) 0 - 53 U/L   Alkaline Phosphatase 94  39 - 117 U/L   Total Bilirubin <0.2 (*) 0.3 - 1.2 mg/dL   GFR calc non Af Amer 76 (*) >90 mL/min   GFR calc Af Amer 89 (*) >90 mL/min   Comment: (NOTE)     The eGFR has been calculated using the CKD EPI equation.     This calculation has not been validated in all clinical situations.     eGFR's persistently <90 mL/min signify possible Chronic Kidney     Disease.  CBC     Status: Abnormal   Collection Time    09/04/13  4:05 AM      Result Value Ref Range   WBC 11.2 (*) 4.0 - 10.5 K/uL   RBC 3.72 (*) 4.22 - 5.81 MIL/uL   Hemoglobin 11.7 (*) 13.0 - 17.0 g/dL   HCT 34.2 (*) 39.0 - 52.0 %   MCV 91.9  78.0 - 100.0 fL   MCH 31.5  26.0 - 34.0 pg  MCHC 34.2  30.0 - 36.0 g/dL   RDW 78.4  29.2 - 77.2 %   Platelets 330  150 - 400 K/uL  COMPREHENSIVE METABOLIC PANEL     Status: Abnormal   Collection Time    09/04/13  4:05 AM      Result Value Ref Range   Sodium 137  137 - 147 mEq/L   Potassium 4.4  3.7 - 5.3 mEq/L   Chloride 101  96 - 112 mEq/L   CO2 29  19 - 32 mEq/L   Glucose, Bld 118 (*) 70 - 99 mg/dL   BUN 8  6 - 23 mg/dL   Creatinine, Ser 6.24  0.50 - 1.35 mg/dL   Calcium 8.4  8.4 - 20.9 mg/dL   Total Protein 5.7 (*) 6.0 - 8.3 g/dL   Albumin 2.5 (*) 3.5 - 5.2 g/dL   AST 29  0 - 37 U/L   ALT 50  0 - 53 U/L   Alkaline Phosphatase 79  39 - 117 U/L   Total Bilirubin 0.2 (*) 0.3 - 1.2 mg/dL   GFR calc non Af Amer 72 (*) >90 mL/min   GFR calc Af Amer 83 (*) >90 mL/min   Comment: (NOTE)     The eGFR has been calculated using the CKD EPI equation.     This calculation has not been validated in all clinical situations.     eGFR's persistently <90 mL/min signify possible Chronic Kidney      Disease.   Ct Abdomen Pelvis W Contrast  08/31/2013   CLINICAL DATA:  Right lower quadrant pain and some left lower quadrant pain. History of diarrhea last week. No previous relevant surgery.  EXAM: CT ABDOMEN AND PELVIS WITH CONTRAST  TECHNIQUE: Multidetector CT imaging of the abdomen and pelvis was performed using the standard protocol following bolus administration of intravenous contrast.  CONTRAST:  53mL OMNIPAQUE IOHEXOL 300 MG/ML  SOLN  COMPARISON:  MR L SPINE WO/W CM dated 03/30/2011  FINDINGS: The lung bases are essentially clear. There is no pleural or pericardial effusion. Coronary artery calcifications and a small hiatal hernia are noted.  The gallbladder demonstrates irregular wall thickening with surrounding inflammatory change suspicious for cholecystitis. There are multiple calcified and noncalcified gallstones, including 1 measuring 13 mm in the gallbladder neck. There is no intra or extrahepatic biliary dilatation. No calcified common duct stones are seen.  The liver, spleen, pancreas and adrenal glands appear normal. There are possible tiny nonobstructing left renal calculi. The right kidney appears normal. There is no hydronephrosis.  The stomach and small bowel appear normal. There is a retrocecal appendix which is normal in caliber without surrounding inflammation. There are sigmoid colon diverticular changes without surrounding inflammation.  There is mild aortoiliac atherosclerosis and moderate enlargement of the prostate gland. The seminal vesicles and urinary bladder appear normal.  There is lower lumbar spondylosis with advanced disc space loss at L4-5 and L5-S1. No worrisome osseous findings are demonstrated.  IMPRESSION: 1. Findings are consistent with acute cholecystitis. There is no evidence of biliary obstruction. 2. No evidence of appendicitis. 3. Sigmoid diverticulosis without evidence of acute inflammation. 4. Possible nonobstructing left renal calculi. 5. These results were  called by telephone at the time of interpretation on 08/31/2013 at 4:28 PM to Dr. Marjory Lies , who verbally acknowledged these results.   Electronically Signed   By: Roxy Horseman M.D.   On: 08/31/2013 16:28   Treatments:  Laparoscopic Cholecystectomy with IOC Procedure Note 09/02/2013 Indications:  This patient presents with acute cholecystitis and will undergo laparoscopic cholecystectomy.   Pre-operative Diagnosis: acute cholecystitis with calculous  Post-operative Diagnosis: Same + choledocholithiasis  Surgeon: [facility]    ERCP PROCEDURE REPORT  PATIENT: Norberto, Wishon MR# :[id number]  BIRTHDATE: August 30, 1945 GENDER: Male  ENDOSCOPIST: Jeani Hawking, MD  REFERRED BY: CCS  PROCEDURE DATE: 09/03/2013  PROCEDURE: ERCP with stent insertion ENDOSCOPIC IMPRESSION:  1) Choledocholithiasis.  RECOMMENDATIONS:  1) Repeat ERCP in one month with anesthesia.    Discharge Exam: Blood pressure 109/67, pulse 92, temperature 98.1 F (36.7 C), temperature source Oral, resp. rate 22, height 5\' 10"  (1.778 m), weight 188 lb (85.276 kg), SpO2 90.00%.  General: Pt awake/alert/oriented x4 in no major acute distress Eyes: PERRL, normal EOM. Sclera nonicteric Neuro: CN II-XII intact w/o focal sensory/motor deficits. Lymph: No head/neck/groin lymphadenopathy Psych:  No delerium/psychosis/paranoia HENT: Normocephalic, Mucus membranes moist.  No thrush Neck: Supple, No tracheal deviation Chest: No pain.  Good respiratory excursion. CV:  Pulses intact.  Regular rhythm MS: Normal AROM mjr joints.  No obvious deformity Abdomen: Soft, Min distended.  Incisions c/d/i.  Min RUQ TTP.  No incarcerated hernias. Ext:  SCDs BLE.  No significant edema.  No cyanosis Skin: No petechiae / purpura   Disposition: Final discharge disposition not confirmed  Discharge Orders   Future Orders Complete By Expires   Call MD for:  extreme fatigue  As directed    Call MD for:  hives  As directed    Call MD for:   persistant nausea and vomiting  As directed    Call MD for:  redness, tenderness, or signs of infection (pain, swelling, redness, odor or green/yellow discharge around incision site)  As directed    Call MD for:  severe uncontrolled pain  As directed    Call MD for:  As directed    Diet - low sodium heart healthy  As directed    Discharge instructions  As directed    Discharge wound care:  As directed    Driving Restrictions  As directed    Increase activity slowly  As directed    Lifting restrictions  As directed    May shower / Bathe  As directed    May walk up steps  As directed    Sexual Activity Restrictions  As directed    Walk with assistance  As directed        Medication List         amoxicillin-clavulanate 875-125 MG per tablet  Commonly known as:  AUGMENTIN  Take 1 tablet by mouth 2 (two) times daily.     aspirin EC 81 MG tablet  Take 81 mg by mouth at bedtime.     citalopram 20 MG tablet  Commonly known as:  CELEXA  Take 20 mg by mouth daily.     docusate sodium 100 MG capsule  Commonly known as:  COLACE  Take 100 mg by mouth at bedtime.     fexofenadine 180 MG tablet  Commonly known as:  ALLEGRA  Take 180 mg by mouth at bedtime.     HYDROcodone-acetaminophen 5-325 MG per tablet  Commonly known as:  NORCO/VICODIN  Take 1-2 tablets by mouth every 4 (four) hours as needed for moderate pain or severe pain.     MULTIPLE VITAMIN PO  Take 1 tablet by mouth daily.     naproxen 500 MG tablet  Commonly known as:  NAPROSYN  Take 1 tablet (500 mg total) by mouth 2 (two)  times daily with a meal.     traZODone 100 MG tablet  Commonly known as:  DESYREL  Take 200 mg by mouth at bedtime.     venlafaxine XR 75 MG 24 hr capsule  Commonly known as:  EFFEXOR-XR  Take 225 mg by mouth daily with breakfast.     vitamin E 400 UNIT capsule  Take 400 Units by mouth daily.           Follow-up Information   Follow up with CCS,MD, MD. Schedule an appointment as soon  as possible for a visit in 2 weeks. (Call [phone] to follow up after your operation, To follow up after your hospital stay)    Specialty:  General Surgery      Signed: Adin Hector 09/05/2013, 12:30 PM

## 2013-09-07 ENCOUNTER — Telehealth (INDEPENDENT_AMBULATORY_CARE_PROVIDER_SITE_OTHER): Payer: Self-pay

## 2013-09-07 NOTE — Telephone Encounter (Signed)
Needs P/O appt sx 09-02-13 please advise

## 2013-09-07 NOTE — Telephone Encounter (Signed)
Pt has post op appt scheduled with Dr. Donell BeersByerly, 09/29/13 at 1:30 p.m.

## 2013-09-07 NOTE — Telephone Encounter (Signed)
Find a post op... Have fun.  Sometime end of next week, beginning of the following week.

## 2013-09-28 ENCOUNTER — Encounter (HOSPITAL_COMMUNITY): Payer: Self-pay | Admitting: Pharmacy Technician

## 2013-09-28 ENCOUNTER — Other Ambulatory Visit: Payer: Self-pay | Admitting: Gastroenterology

## 2013-09-29 ENCOUNTER — Encounter (INDEPENDENT_AMBULATORY_CARE_PROVIDER_SITE_OTHER): Payer: Self-pay | Admitting: General Surgery

## 2013-09-29 ENCOUNTER — Ambulatory Visit (INDEPENDENT_AMBULATORY_CARE_PROVIDER_SITE_OTHER): Payer: Medicare HMO | Admitting: General Surgery

## 2013-09-29 VITALS — BP 124/74 | HR 95 | Temp 98.3°F | Ht 69.0 in | Wt 175.0 lb

## 2013-09-29 DIAGNOSIS — K8050 Calculus of bile duct without cholangitis or cholecystitis without obstruction: Secondary | ICD-10-CM

## 2013-09-29 DIAGNOSIS — K812 Acute cholecystitis with chronic cholecystitis: Secondary | ICD-10-CM

## 2013-09-29 NOTE — Patient Instructions (Signed)
Follow up with Dr. Elnoria HowardHung for stent removal.    Follow up with me as needed.    The abdominal pain should start to recede.

## 2013-10-01 ENCOUNTER — Encounter (HOSPITAL_COMMUNITY): Payer: Self-pay | Admitting: *Deleted

## 2013-10-03 NOTE — Progress Notes (Signed)
HISTORY: Pt is a 68 yo M approximately 3 weeks s/p lap chole for acute cholecystitis.  He was seen to have common duct stones on cholangiogram and underwent ERCP the next day.  He was difficult to sedate, and so he did not get a sphincterotomy, just a stent.  He is doing well overall.  He is not having fevers or chills.      EXAM: General:  Alert and oriented.   Incision:  Healing well, no evidence of infection     PATHOLOGY: Acute cholecystitis and cholelithiasis.     ASSESSMENT AND PLAN:   Acute cholecystitis with chronic cholecystitis No evidence of complications.  Follow up as needed.    Choledocholithiasis s/p ERCP/stent 09/03/2013 Pt has appt to get stent removed with Dr. Elnoria Howard.    He had issues with MAC and ERCP.  He will get more anesthesia for this one.  Also, he will need to be assessed to make sure stones have passed and possibly get sphincterotomy.        Maudry Diego, MD Surgical Oncology, General & Endocrine Surgery [facility]  BURNETT,BRENT A, MD No ref. provider found

## 2013-10-03 NOTE — Assessment & Plan Note (Signed)
No evidence of complications.  Follow up as needed.

## 2013-10-03 NOTE — Assessment & Plan Note (Signed)
Pt has appt to get stent removed with Dr. Elnoria Howard.    He had issues with MAC and ERCP.  He will get more anesthesia for this one.  Also, he will need to be assessed to make sure stones have passed and possibly get sphincterotomy.

## 2013-10-16 ENCOUNTER — Ambulatory Visit (HOSPITAL_COMMUNITY)
Admission: RE | Admit: 2013-10-16 | Discharge: 2013-10-16 | Disposition: A | Discharge status: Home / Self Care | Payer: Medicare HMO | Source: Ambulatory Visit | Attending: Gastroenterology | Admitting: Gastroenterology

## 2013-10-16 ENCOUNTER — Ambulatory Visit (HOSPITAL_COMMUNITY): Payer: Medicare HMO | Admitting: Anesthesiology

## 2013-10-16 ENCOUNTER — Encounter (HOSPITAL_COMMUNITY): Payer: Medicare HMO | Admitting: Anesthesiology

## 2013-10-16 ENCOUNTER — Encounter (HOSPITAL_COMMUNITY)
Admission: RE | Disposition: A | Discharge status: Home / Self Care | Payer: Self-pay | Source: Ambulatory Visit | Attending: Gastroenterology

## 2013-10-16 ENCOUNTER — Ambulatory Visit (HOSPITAL_COMMUNITY): Payer: Medicare HMO

## 2013-10-16 ENCOUNTER — Encounter (HOSPITAL_COMMUNITY): Payer: Self-pay | Admitting: Certified Registered"

## 2013-10-16 DIAGNOSIS — F17200 Nicotine dependence, unspecified, uncomplicated: Secondary | ICD-10-CM | POA: Insufficient documentation

## 2013-10-16 DIAGNOSIS — K8050 Calculus of bile duct without cholangitis or cholecystitis without obstruction: Secondary | ICD-10-CM | POA: Insufficient documentation

## 2013-10-16 HISTORY — PX: ENDOSCOPIC RETROGRADE CHOLANGIOPANCREATOGRAPHY (ERCP) WITH PROPOFOL: SHX5810

## 2013-10-16 HISTORY — PX: BILIARY STENT PLACEMENT: SHX5538

## 2013-10-16 SURGERY — ENDOSCOPIC RETROGRADE CHOLANGIOPANCREATOGRAPHY (ERCP) WITH PROPOFOL
Anesthesia: Monitor Anesthesia Care

## 2013-10-16 MED ORDER — FENTANYL CITRATE 0.05 MG/ML IJ SOLN
INTRAMUSCULAR | Status: AC
  Filled 2013-10-16: qty 2

## 2013-10-16 MED ORDER — PROPOFOL 10 MG/ML IV BOLUS
INTRAVENOUS | Status: DC | PRN
  Administered 2013-10-16: 50 mg via INTRAVENOUS
  Administered 2013-10-16: 30 mg via INTRAVENOUS

## 2013-10-16 MED ORDER — LACTATED RINGERS IV SOLN
INTRAVENOUS | Status: DC | PRN
  Administered 2013-10-16: 09:00:00 via INTRAVENOUS

## 2013-10-16 MED ORDER — FENTANYL CITRATE 0.05 MG/ML IJ SOLN
INTRAMUSCULAR | Status: DC | PRN
  Administered 2013-10-16: 25 ug via INTRAVENOUS
  Administered 2013-10-16: 50 ug via INTRAVENOUS
  Administered 2013-10-16: 25 ug via INTRAVENOUS

## 2013-10-16 MED ORDER — PROPOFOL INFUSION 10 MG/ML OPTIME
INTRAVENOUS | Status: DC | PRN
  Administered 2013-10-16: 140 ug/kg/min via INTRAVENOUS

## 2013-10-16 MED ORDER — PROPOFOL 10 MG/ML IV BOLUS
INTRAVENOUS | Status: AC
  Filled 2013-10-16: qty 20

## 2013-10-16 MED ORDER — SODIUM CHLORIDE 0.9 % IV SOLN: INTRAVENOUS | Status: DC

## 2013-10-16 MED ORDER — SODIUM CHLORIDE 0.9 % IV SOLN
INTRAVENOUS | Status: DC | PRN
  Administered 2013-10-16: 10:00:00

## 2013-10-16 MED ORDER — ONDANSETRON HCL 4 MG/2ML IJ SOLN
INTRAMUSCULAR | Status: AC
  Filled 2013-10-16: qty 2

## 2013-10-16 NOTE — H&P (View-Only) (Signed)
HISTORY: Pt is a 67 yo M approximately 3 weeks s/p lap chole for acute cholecystitis.  He was seen to have common duct stones on cholangiogram and underwent ERCP the next day.  He was difficult to sedate, and so he did not get a sphincterotomy, just a stent.  He is doing well overall.  He is not having fevers or chills.      EXAM: General:  Alert and oriented.   Incision:  Healing well, no evidence of infection     PATHOLOGY: Acute cholecystitis and cholelithiasis.     ASSESSMENT AND PLAN:   Acute cholecystitis with chronic cholecystitis No evidence of complications.  Follow up as needed.    Choledocholithiasis s/p ERCP/stent 09/03/2013 Pt has appt to get stent removed with Dr. Hung.    He had issues with MAC and ERCP.  He will get more anesthesia for this one.  Also, he will need to be assessed to make sure stones have passed and possibly get sphincterotomy.        Dashiell Franchino L Doniqua Saxby, MD Surgical Oncology, General & Endocrine Surgery [facility]  BURNETT,BRENT A, MD No ref. provider found    

## 2013-10-16 NOTE — Anesthesia Postprocedure Evaluation (Signed)
  Anesthesia Post-op Note  Patient: Ernest Horton  Procedure(s) Performed: Procedure(s) (LRB): ENDOSCOPIC RETROGRADE CHOLANGIOPANCREATOGRAPHY (ERCP) WITH PROPOFOL (N/A) BILIARY STENT PLACEMENT (N/A)  Patient Location: PACU  Anesthesia Type: MAC  Level of Consciousness: awake and alert   Airway and Oxygen Therapy: Patient Spontanous Breathing  Post-op Pain: mild  Post-op Assessment: Post-op Vital signs reviewed, Patient's Cardiovascular Status Stable, Respiratory Function Stable, Patent Airway and No signs of Nausea or vomiting  Last Vitals:  Filed Vitals:   10/16/13 1045  BP: 116/75  Pulse: 81  Temp:   Resp: 22    Post-op Vital Signs: stable   Complications: No apparent anesthesia complications

## 2013-10-16 NOTE — Op Note (Signed)
[facility] [address] [place], [address]   ERCP PROCEDURE REPORT  PATIENT: Ernest Horton, Ernest Horton  MR# :[id number] BIRTHDATE: April 03, 1946  GENDER: Male ENDOSCOPIST: Jeani Hawking, MD REFERRED BY: PROCEDURE DATE:  10/16/2013 PROCEDURE:   ERCP with removal of calculus/calculi ASA CLASS:   Class III INDICATIONS:Choledocholithiasis. MEDICATIONS: MAC sedation, administered by CRNA TOPICAL ANESTHETIC: none  DESCRIPTION OF PROCEDURE:   After the risks benefits and alternatives of the procedure were thoroughly explained, informed consent was obtained.  The     endoscope was introduced through the mouth  and advanced to the second portion of the duodenum. Maneuvering into the duodenum was much easier during this procedure.  The ampulla was identified and the biliary stent was in position.  The stent was removed with a snare and then the CBD was cannulated during the first pass.  No pancreatic duct cannulation occurred.  Contrast injection revealed a mildly dilated CBD with multiple filling defects.  No bile duct leak identified.  A 1.5 cm sphincterotomy was created without any immediate complication. Keeping the ampulla center in the field of view was difficult with his anatomy.  The CBD was swept four times.  Multiple stones were extracted during the first and second passes.  During the fifth and final pass, an occlusion cholangiogram was performed and there was no evidence of any retained stones.     The scope was then completely withdrawn from the patient and the procedure terminated.     COMPLICATIONS:  ENDOSCOPIC IMPRESSION: 1) Choledocholithiasis s/p successful extraction.  RECOMMENDATIONS: 1) No further GI intervention required.    _______________________________ eSignedJeani Hawking, MD 10/16/2013 10:24 AM   CC:

## 2013-10-16 NOTE — Discharge Instructions (Addendum)
Monitored Anesthesia Care  °Monitored anesthesia care is an anesthesia service for a medical procedure. Anesthesia is the loss of the ability to feel pain. It is produced by medications called anesthetics. It may affect a small area of your body (local anesthesia), a large area of your body (regional anesthesia), or your entire body (general anesthesia). The need for monitored anesthesia care depends your procedure, your condition, and the potential need for regional or general anesthesia. It is often provided during procedures where:  °· General anesthesia may be needed if there are complications. This is because you need special care when you are under general anesthesia.   °· You will be under local or regional anesthesia. This is so that you are able to have higher levels of anesthesia if needed.   °· You will receive calming medications (sedatives). This is especially the case if sedatives are given to put you in a semi-conscious state of relaxation (deep sedation). This is because the amount of sedative needed to produce this state can be hard to predict. Too much of a sedative can produce general anesthesia. °Monitored anesthesia care is performed by one or more caregivers who have special training in all types of anesthesia. You will need to meet with these caregivers before your procedure. During this meeting, they will ask you about your medical history. They will also give you instructions to follow. (For example, you will need to stop eating and drinking before your procedure. You may also need to stop or change medications you are taking.) During your procedure, your caregivers will stay with you. They will:  °· Watch your condition. This includes watching you blood pressure, breathing, and level of pain.   °· Diagnose and treat problems that occur.   °· Give medications if they are needed. These may include calming medications (sedatives) and anesthetics.   °· Make sure you are comfortable.   °Having  monitored anesthesia care does not necessarily mean that you will be under anesthesia. It does mean that your caregivers will be able to manage anesthesia if you need it or if it occurs. It also means that you will be able to have a different type of anesthesia than you are having if you need it. When your procedure is complete, your caregivers will continue to watch your condition. They will make sure any medications wear off before you are allowed to go home.  °Document Released: 01/24/2005 Document Revised: 08/25/2012 Document Reviewed: 06/11/2012 °[employer]® Patient Information ©2014 [employer]. ° °

## 2013-10-16 NOTE — Anesthesia Preprocedure Evaluation (Addendum)
Anesthesia Evaluation  Patient identified by MRN, date of birth, ID band Patient awake    Reviewed: Allergy & Precautions, H&P , NPO status , Patient's Chart, lab work & pertinent test results  Airway Mallampati: II TM Distance: >3 FB Neck ROM: full    Dental  (+) Edentulous Upper, Edentulous Lower, Dental Advisory Given   Pulmonary shortness of breath and with exertion, Current Smoker,  breath sounds clear to auscultation  Pulmonary exam normal       Cardiovascular Exercise Tolerance: Good negative cardio ROS  Rhythm:regular Rate:Normal     Neuro/Psych negative neurological ROS  negative psych ROS   GI/Hepatic negative GI ROS, Neg liver ROS,   Endo/Other  negative endocrine ROS  Renal/GU negative Renal ROS  negative genitourinary   Musculoskeletal   Abdominal   Peds  Hematology negative hematology ROS (+)   Anesthesia Other Findings   Reproductive/Obstetrics negative OB ROS                        Anesthesia Physical Anesthesia Plan  ASA: II  Anesthesia Plan: MAC   Post-op Pain Management:    Induction:   Airway Management Planned:   Additional Equipment:   Intra-op Plan:   Post-operative Plan:   Informed Consent: I have reviewed the patients History and Physical, chart, labs and discussed the procedure including the risks, benefits and alternatives for the proposed anesthesia with the patient or authorized representative who has indicated his/her understanding and acceptance.   Dental Advisory Given  Plan Discussed with: CRNA and Surgeon  Anesthesia Plan Comments:       Anesthesia Quick Evaluation

## 2013-10-16 NOTE — Transfer of Care (Signed)
Immediate Anesthesia Transfer of Care Note  Patient: Ernest Horton  Procedure(s) Performed: Procedure(s) (LRB): ENDOSCOPIC RETROGRADE CHOLANGIOPANCREATOGRAPHY (ERCP) WITH PROPOFOL (N/A) BILIARY STENT PLACEMENT (N/A)  Patient Location: PACU  Anesthesia Type: MAC  Level of Consciousness: sedated, patient cooperative and responds to stimulation  Airway & Oxygen Therapy: Patient Spontanous Breathing and Patient connected to face mask oxgen  Post-op Assessment: Report given to PACU RN and Post -op Vital signs reviewed and stable  Post vital signs: Reviewed and stable  Complications: No apparent anesthesia complications

## 2013-10-16 NOTE — Interval H&P Note (Signed)
History and Physical Interval Note:  10/16/2013 6:53 AM  [name]  has presented today for surgery, with the diagnosis of stone/stent removal  The various methods of treatment have been discussed with the patient and family. After consideration of risks, benefits and other options for treatment, the patient has consented to  Procedure(s): ENDOSCOPIC RETROGRADE CHOLANGIOPANCREATOGRAPHY (ERCP) WITH PROPOFOL (N/A) BILIARY STENT PLACEMENT (N/A) as a surgical intervention .  The patient's history has been reviewed, patient examined, no change in status, stable for surgery.  I have reviewed the patient's chart and labs.  Questions were answered to the patient's satisfaction.     Ernest Horton

## 2013-10-19 ENCOUNTER — Encounter (HOSPITAL_COMMUNITY): Payer: Self-pay | Admitting: Gastroenterology

## 2014-09-22 IMAGING — RF DG ERCP WO/W SPHINCTEROTOMY
4 series · 7 of 7 positions shown · non-contrast
Comparison: 09/03/2013

CLINICAL DATA: Stone removal

EXAM:
ERCP
TECHNIQUE: Multiple spot images obtained with the fluoroscopic device and
submitted for interpretation post-procedure.

[Series 1: cont. · 4 of 4 slices shown (1 of 4)]
[im 1/4]
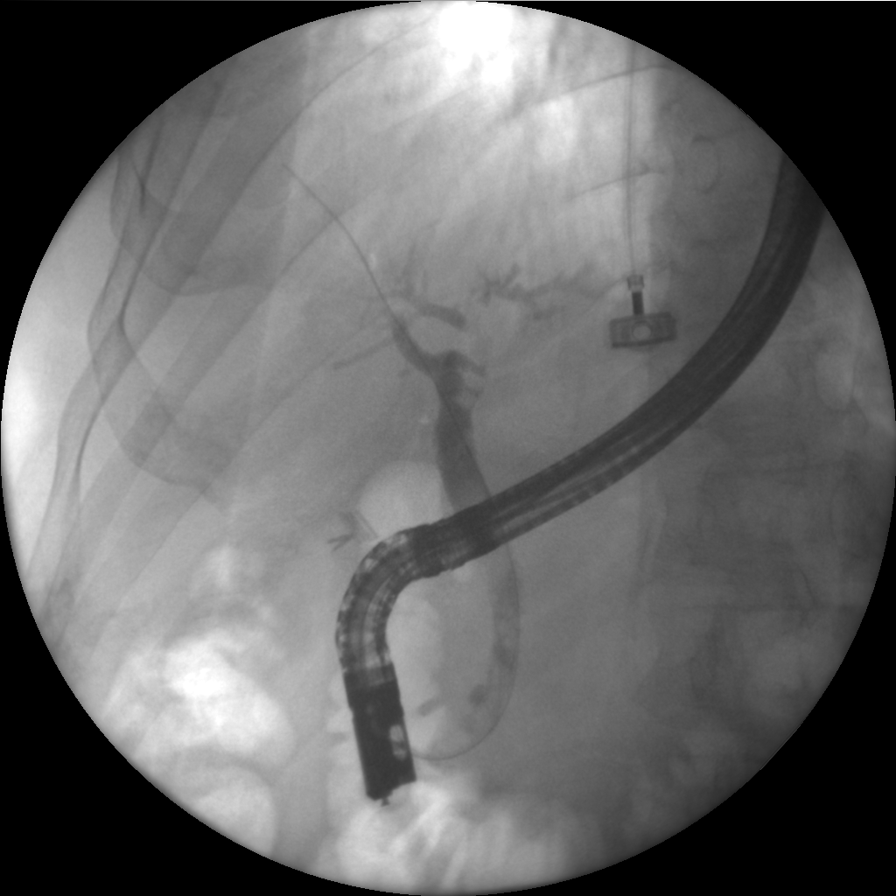
[im 2/4]
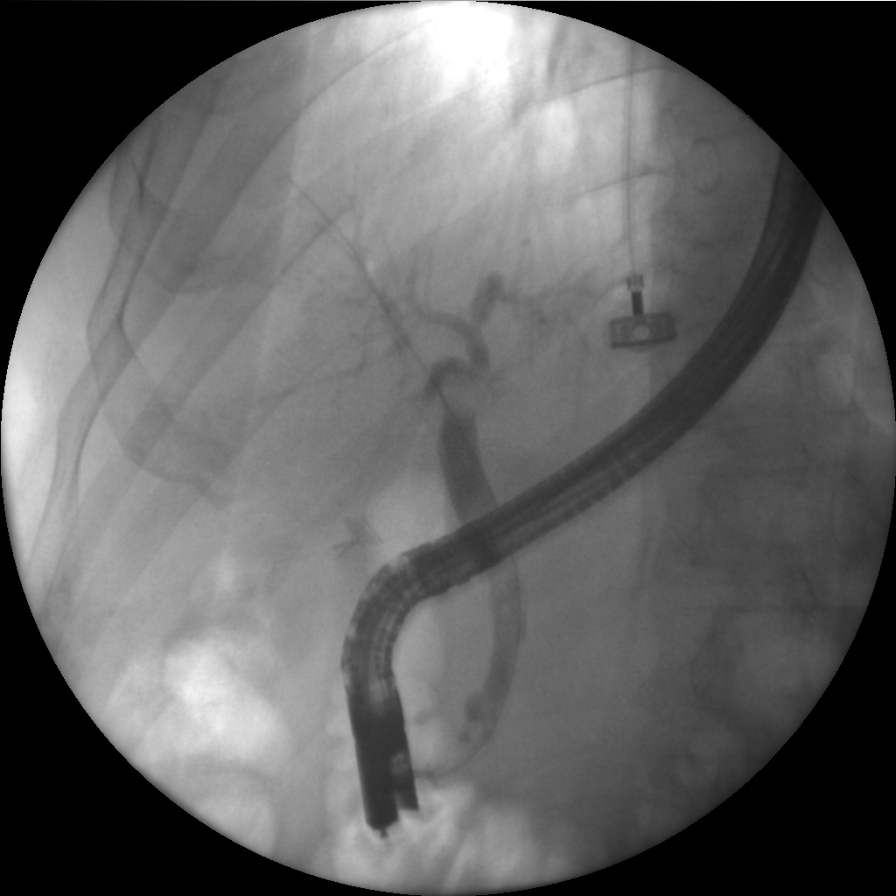
[im 3/4]
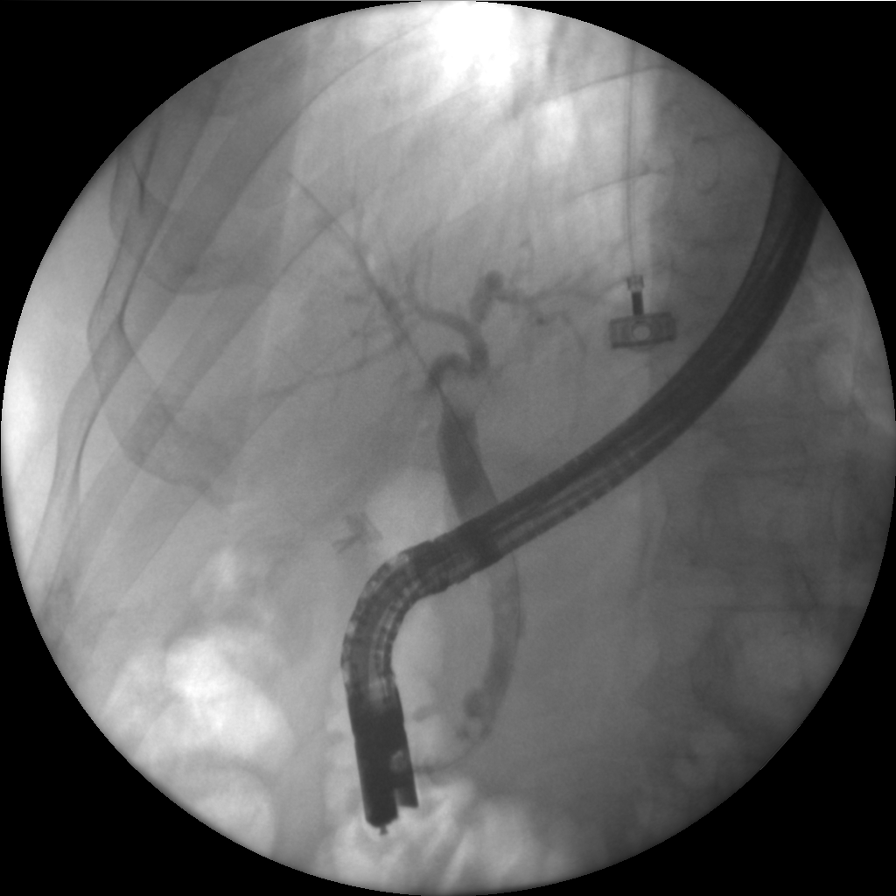
[im 4/4]
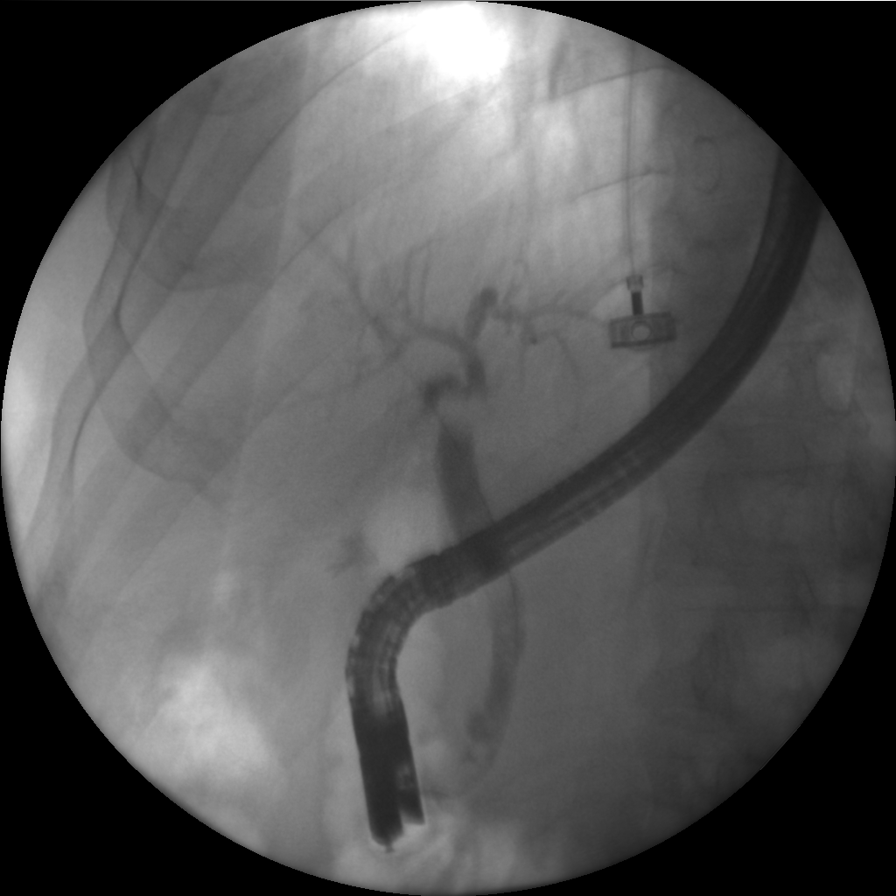

[Series 2: cont. · 1 of 1 slices shown (2 of 4)]
[im 1/1]
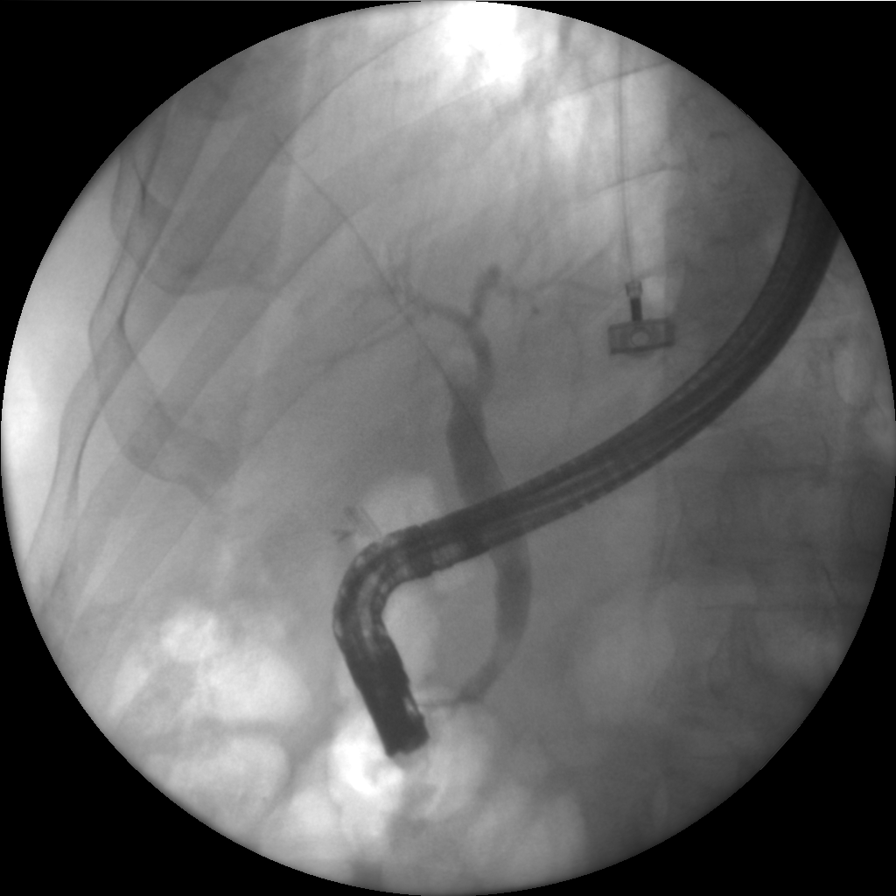

[Series 3: cont. · 1 of 1 slices shown (3 of 4)]
[im 1/1]
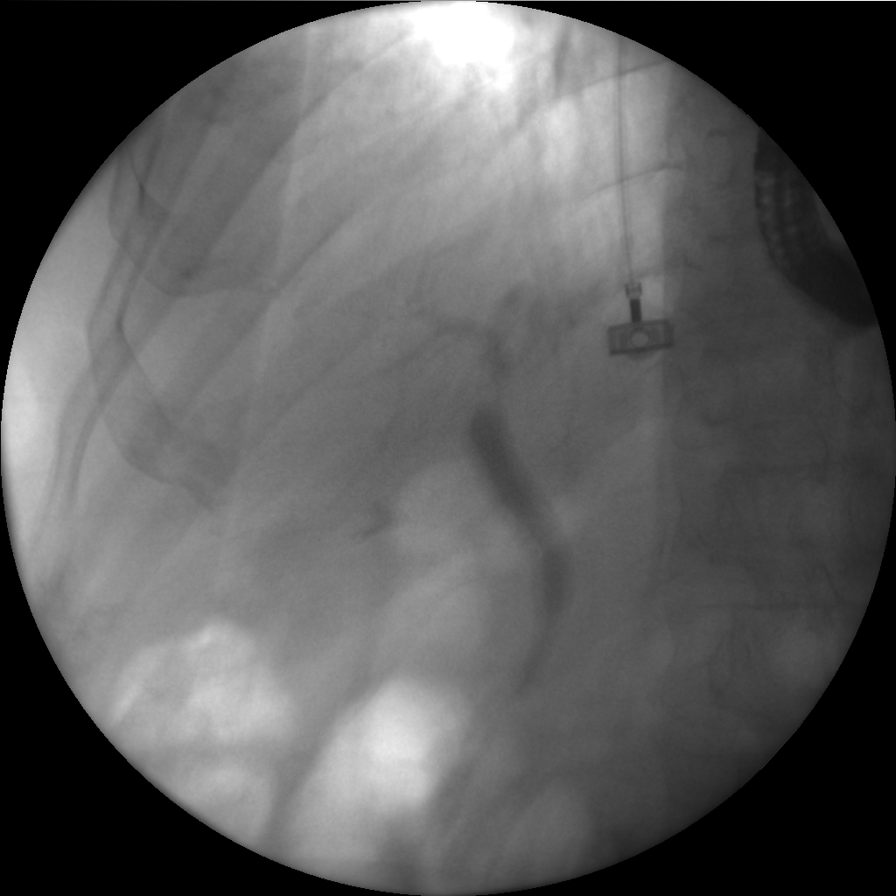

[Series 4: cont. · 1 of 1 slices shown (4 of 4)]
[im 1/1]
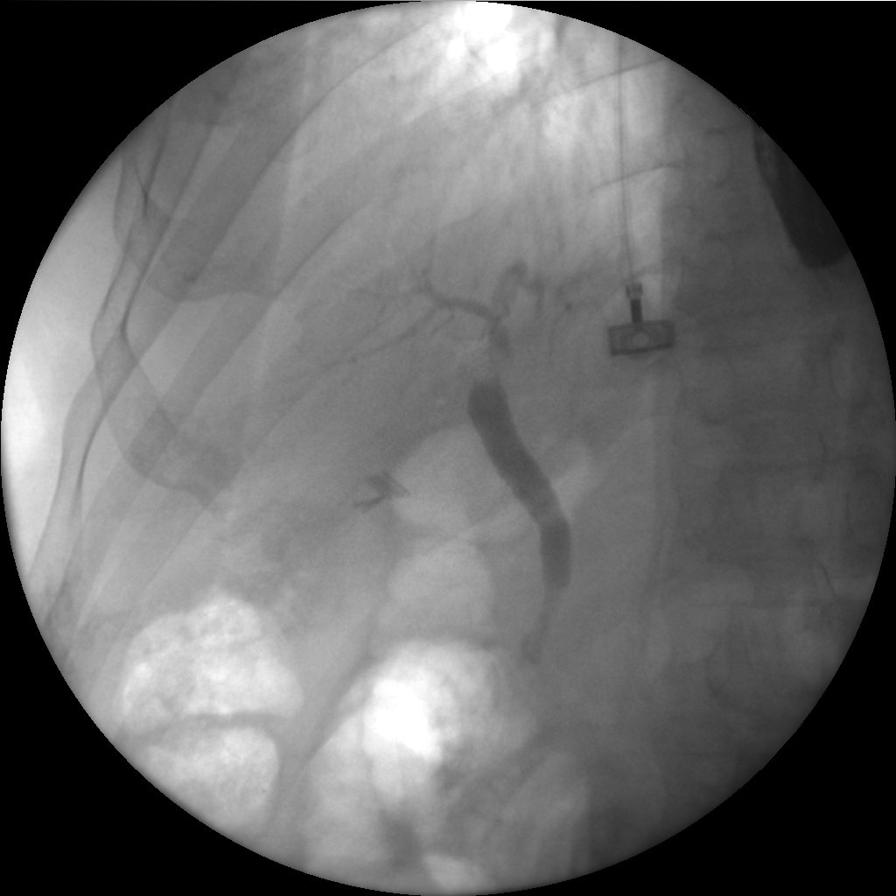

[7 of 7 positions shown; findings below may reference images not displayed]

FINDINGS: Multiple filling defects are present in the biliary tree. Stone
removal has been performed.
IMPRESSION: ERCP and stone removal.

These images were submitted for radiologic interpretation only.
Please see the procedural report for the amount of contrast and the
fluoroscopy time utilized.
